# Patient Record
Sex: Female | Born: 1960 | Race: White | Hispanic: No | Marital: Married | Smoking: Never smoker
Health system: Southern US, Community
[De-identification: ages and names within clinical notes are randomized; demographics above are authoritative.]

## PROBLEM LIST (undated history)

## (undated) DIAGNOSIS — R739 Hyperglycemia, unspecified: Secondary | ICD-10-CM

## (undated) DIAGNOSIS — N951 Menopausal and female climacteric states: Secondary | ICD-10-CM

## (undated) DIAGNOSIS — B009 Herpesviral infection, unspecified: Secondary | ICD-10-CM

## (undated) DIAGNOSIS — E559 Vitamin D deficiency, unspecified: Secondary | ICD-10-CM

## (undated) DIAGNOSIS — C4491 Basal cell carcinoma of skin, unspecified: Secondary | ICD-10-CM

## (undated) DIAGNOSIS — E785 Hyperlipidemia, unspecified: Secondary | ICD-10-CM

## (undated) DIAGNOSIS — K219 Gastro-esophageal reflux disease without esophagitis: Secondary | ICD-10-CM

## (undated) HISTORY — DX: Herpesviral infection, unspecified: B00.9

## (undated) HISTORY — DX: Menopausal and female climacteric states: N95.1

## (undated) HISTORY — PX: TUBAL LIGATION: SHX77

## (undated) HISTORY — DX: Hyperlipidemia, unspecified: E78.5

## (undated) HISTORY — DX: Vitamin D deficiency, unspecified: E55.9

## (undated) HISTORY — PX: BREAST ENHANCEMENT SURGERY: SHX7

## (undated) HISTORY — DX: Gastro-esophageal reflux disease without esophagitis: K21.9

## (undated) HISTORY — DX: Basal cell carcinoma of skin, unspecified: C44.91

## (undated) HISTORY — DX: Hyperglycemia, unspecified: R73.9

## (undated) HISTORY — PX: BACK SURGERY: SHX140

## (undated) HISTORY — PX: CHOLECYSTECTOMY: SHX55

---

## 2007-01-31 ENCOUNTER — Emergency Department (HOSPITAL_COMMUNITY): Admission: EM | Admit: 2007-01-31 | Discharge: 2007-01-31 | Payer: Self-pay | Admitting: Emergency Medicine

## 2008-04-04 ENCOUNTER — Emergency Department (HOSPITAL_COMMUNITY): Admission: EM | Admit: 2008-04-04 | Discharge: 2008-04-04 | Payer: Self-pay | Admitting: Emergency Medicine

## 2008-04-22 ENCOUNTER — Emergency Department (HOSPITAL_COMMUNITY): Admission: EM | Admit: 2008-04-22 | Discharge: 2008-04-22 | Payer: Self-pay | Admitting: Emergency Medicine

## 2010-12-06 DIAGNOSIS — E785 Hyperlipidemia, unspecified: Secondary | ICD-10-CM | POA: Insufficient documentation

## 2010-12-06 DIAGNOSIS — Z85828 Personal history of other malignant neoplasm of skin: Secondary | ICD-10-CM | POA: Insufficient documentation

## 2010-12-06 DIAGNOSIS — B009 Herpesviral infection, unspecified: Secondary | ICD-10-CM

## 2010-12-24 LAB — HM MAMMOGRAPHY

## 2011-03-10 ENCOUNTER — Encounter: Payer: Self-pay | Admitting: Family Medicine

## 2011-03-10 DIAGNOSIS — R739 Hyperglycemia, unspecified: Secondary | ICD-10-CM | POA: Insufficient documentation

## 2011-06-12 LAB — URINALYSIS, ROUTINE W REFLEX MICROSCOPIC
Glucose, UA: NEGATIVE
Ketones, ur: NEGATIVE
Leukocytes, UA: NEGATIVE
Nitrite: NEGATIVE
Protein, ur: NEGATIVE

## 2011-06-12 LAB — URINE MICROSCOPIC-ADD ON

## 2011-06-12 LAB — PREGNANCY, URINE: Preg Test, Ur: NEGATIVE

## 2013-06-28 ENCOUNTER — Other Ambulatory Visit: Payer: Self-pay | Admitting: Family Medicine

## 2013-07-04 ENCOUNTER — Other Ambulatory Visit: Payer: Self-pay | Admitting: Family Medicine

## 2013-07-20 ENCOUNTER — Ambulatory Visit (INDEPENDENT_AMBULATORY_CARE_PROVIDER_SITE_OTHER): Admitting: Family Medicine

## 2013-07-20 ENCOUNTER — Encounter: Payer: Self-pay | Admitting: Family Medicine

## 2013-07-20 ENCOUNTER — Ambulatory Visit (INDEPENDENT_AMBULATORY_CARE_PROVIDER_SITE_OTHER)

## 2013-07-20 VITALS — BP 145/83 | HR 86 | Temp 98.1°F | Ht 62.0 in | Wt 170.0 lb

## 2013-07-20 DIAGNOSIS — K3189 Other diseases of stomach and duodenum: Secondary | ICD-10-CM

## 2013-07-20 DIAGNOSIS — Z85828 Personal history of other malignant neoplasm of skin: Secondary | ICD-10-CM

## 2013-07-20 DIAGNOSIS — R079 Chest pain, unspecified: Secondary | ICD-10-CM

## 2013-07-20 DIAGNOSIS — R5381 Other malaise: Secondary | ICD-10-CM

## 2013-07-20 DIAGNOSIS — E559 Vitamin D deficiency, unspecified: Secondary | ICD-10-CM | POA: Insufficient documentation

## 2013-07-20 DIAGNOSIS — R1013 Epigastric pain: Secondary | ICD-10-CM

## 2013-07-20 DIAGNOSIS — N951 Menopausal and female climacteric states: Secondary | ICD-10-CM | POA: Insufficient documentation

## 2013-07-20 DIAGNOSIS — IMO0001 Reserved for inherently not codable concepts without codable children: Secondary | ICD-10-CM

## 2013-07-20 DIAGNOSIS — E785 Hyperlipidemia, unspecified: Secondary | ICD-10-CM

## 2013-07-20 DIAGNOSIS — R739 Hyperglycemia, unspecified: Secondary | ICD-10-CM

## 2013-07-20 DIAGNOSIS — M2550 Pain in unspecified joint: Secondary | ICD-10-CM

## 2013-07-20 DIAGNOSIS — R7309 Other abnormal glucose: Secondary | ICD-10-CM

## 2013-07-20 LAB — POCT URINALYSIS DIPSTICK
Bilirubin, UA: NEGATIVE
Blood, UA: NEGATIVE
Glucose, UA: NEGATIVE
Ketones, UA: NEGATIVE
Leukocytes, UA: NEGATIVE
Nitrite, UA: NEGATIVE
Protein, UA: NEGATIVE
Spec Grav, UA: 1.01
Urobilinogen, UA: NEGATIVE
pH, UA: 6

## 2013-07-20 LAB — POCT UA - MICROSCOPIC ONLY
Bacteria, U Microscopic: NEGATIVE
Casts, Ur, LPF, POC: NEGATIVE
Crystals, Ur, HPF, POC: NEGATIVE
Mucus, UA: NEGATIVE
RBC, urine, microscopic: NEGATIVE
WBC, Ur, HPF, POC: NEGATIVE
Yeast, UA: NEGATIVE

## 2013-07-20 LAB — POCT CBC
Granulocyte percent: 71.4 %G (ref 37–80)
HCT, POC: 41.9 % (ref 37.7–47.9)
Hemoglobin: 14.1 g/dL (ref 12.2–16.2)
Lymph, poc: 1 (ref 0.6–3.4)
MCH, POC: 30.4 pg (ref 27–31.2)
MCHC: 33.8 g/dL (ref 31.8–35.4)
MCV: 90.1 fL (ref 80–97)
MPV: 7.6 fL (ref 0–99.8)
POC Granulocyte: 2.7 (ref 2–6.9)
POC LYMPH PERCENT: 25.5 %L (ref 10–50)
Platelet Count, POC: 227 10*3/uL (ref 142–424)
RBC: 4.7 M/uL (ref 4.04–5.48)
RDW, POC: 12.5 %
WBC: 3.8 10*3/uL — AB (ref 4.6–10.2)

## 2013-07-20 LAB — POCT GLYCOSYLATED HEMOGLOBIN (HGB A1C): Hemoglobin A1C: 6

## 2013-07-20 LAB — POCT UA - MICROALBUMIN: Microalbumin Ur, POC: NEGATIVE mg/L

## 2013-07-20 MED ORDER — PANTOPRAZOLE SODIUM 40 MG PO TBEC: 40.0000 mg | DELAYED_RELEASE_TABLET | Freq: Every day | ORAL | Status: DC

## 2013-07-20 NOTE — Progress Notes (Signed)
SUBJECTIVE: CC: Chief Complaint  Patient presents with  . Follow-up    reck labs refills and pain in shoulders    HPI: Patient is here for follow up of hyperlipidemia/hyperglycemia Used to be seen at [facility] and transferred for care. Has been having some problems: denies Headache;denies Chest Pain;denies weakness;denies Shortness of Breath and orthopnea;denies Visual changes;denies palpitations;denies cough;denies pedal edema;denies symptoms of TIA or stroke;deniesClaudication symptoms. admits to Compliance with medications; denies Problems with medications.  Having back problems and muscle aches. Having joints and muscle aches. Could barely lift arms. Tingling numbness. Especially at nights. Wakes up every 2 hours with aches.stopped the lipitor 2 weeks ago. Pains down arms. Neck sore. Fatigue. Heart burn   Past Medical History  Diagnosis Date  . Hyperlipidemia   . Hyperglycemia   . HSV-1 (herpes simplex virus 1) infection   . Basal cell cancer     On forehead.  . Perimenopausal   . Vitamin D deficiency    Past Surgical History  Procedure Laterality Date  . Breast enhancement surgery    . Tubal ligation    . Cholecystectomy    . Back surgery     History   Social History  . Marital Status: Married    Spouse Name: N/A    Number of Children: N/A  . Years of Education: N/A   Occupational History  . Not on file.   Social History Main Topics  . Smoking status: Never Smoker   . Smokeless tobacco: Not on file  . Alcohol Use: Not on file  . Drug Use: Not on file  . Sexual Activity: Not on file   Other Topics Concern  . Not on file   Social History Narrative  . No narrative on file   Family History  Problem Relation Age of Onset  . Heart disease Father    Current Outpatient Prescriptions on File Prior to Visit  Medication Sig Dispense Refill  . aspirin 81 MG EC tablet Take 81 mg by mouth daily.        . fish oil-omega-3 fatty acids 1000 MG capsule Take 2 g by  mouth 2 (two) times daily.        . metFORMIN (GLUCOPHAGE) 500 MG tablet TAKE 1 TABLET BY MOUTH EVERY DAY  30 tablet  0  . Multiple Vitamin (MULTIVITAMIN) tablet Take 1 tablet by mouth daily.        . Multiple Vitamins-Minerals (EYE VITAMINS PO) Take 1 capsule by mouth daily.        Marland Kitchen atorvastatin (LIPITOR) 40 MG tablet Take 40 mg by mouth daily.        Marland Kitchen dexlansoprazole (DEXILANT) 60 MG capsule Take 60 mg by mouth daily.         No current facility-administered medications on file prior to visit.   No Known Allergies Immunization History  Administered Date(s) Administered  . Hepatitis B 05/29/2000, 09/17/2000, 03/17/2001  . Td 04/07/1989, 05/26/2001, 08/25/2002   Prior to Admission medications   Medication Sig Start Date End Date Taking? Authorizing Provider  aspirin 81 MG EC tablet Take 81 mg by mouth daily.     Yes Historical Provider, MD  atorvastatin (LIPITOR) 40 MG tablet Take 40 mg by mouth daily.     Yes Historical Provider, MD  fish oil-omega-3 fatty acids 1000 MG capsule Take 2 g by mouth 2 (two) times daily.     Yes Historical Provider, MD  metFORMIN (GLUCOPHAGE) 500 MG tablet TAKE 1 TABLET BY MOUTH EVERY DAY  07/04/13  Yes Ileana Ladd, MD  Multiple Vitamin (MULTIVITAMIN) tablet Take 1 tablet by mouth daily.     Yes Historical Provider, MD  Multiple Vitamins-Minerals (EYE VITAMINS PO) Take 1 capsule by mouth daily.     Yes Historical Provider, MD  dexlansoprazole (DEXILANT) 60 MG capsule Take 60 mg by mouth daily.      Historical Provider, MD     ROS: As above in the HPI. All other systems are stable or negative.  OBJECTIVE: APPEARANCE:  Patient in no acute distress.The patient appeared well nourished and normally developed. Acyanotic. Waist: VITAL SIGNS:BP 145/83  Pulse 86  Temp(Src) 98.1 F (36.7 C) (Oral)  Ht 5\' 2"  (1.575 m)  Wt 170 lb (77.111 kg)  BMI 31.09 kg/m2  LMP 05/20/2013 bp 145/85 WF  SKIN: warm and  Dry without overt rashes, tattoos and  scars  HEAD and Neck: without JVD, Head and scalp: normal Eyes:No scleral icterus. Fundi normal, eye movements normal. Ears: Auricle normal, canal normal, Tympanic membranes normal, insufflation normal. Nose: normal Throat: normal Neck & thyroid: normal  CHEST & LUNGS: Chest wall: normal Lungs: Clear  CVS: Reveals the PMI to be normally located. Regular rhythm, First and Second Heart sounds are normal,  absence of murmurs, rubs or gallops. Peripheral vasculature: Radial pulses: normal Dorsal pedis pulses: normal Posterior pulses: normal  ABDOMEN:  Appearance: normal Benign, no organomegaly, no masses, no Abdominal Aortic enlargement. No Guarding , no rebound. No Bruits. Bowel sounds: normal  RECTAL: N/A GU: N/A  EXTREMETIES: nonedematous.  MUSCULOSKELETAL:  Spine: normal Joints: intact  NEUROLOGIC: oriented to time,place and person; nonfocal. Strength is normal Sensory is normal Reflexes are normal Cranial Nerves are normal.  ASSESSMENT:  Chest pain, unspecified - Plan: EKG 12-Lead, DG Chest 2 View, EKG 12-Lead, EKG 12-Lead, Magnesium  HLD (hyperlipidemia) - Plan: NMR, lipoprofile, Magnesium  Hyperglycemia - Plan: POCT glycosylated hemoglobin (Hb A1C), POCT UA - Microscopic Only, POCT UA - Microalbumin, POCT urinalysis dipstick, Magnesium  Hyperlipidemia - Plan: Magnesium  History of basal cell cancer - Plan: Magnesium  Arthralgia - Plan: Sedimentation rate, Rheumatoid factor, Magnesium  Myalgia and myositis - Plan: CMP14+EGFR, Sedimentation rate, Vit D  25 hydroxy (rtn osteoporosis monitoring), Vitamin B12, Thyroid Panel With TSH, Folate, CK, ANA, Rheumatoid factor, Lyme Ab/Western Blot Reflex, Myoglobin, serum, Lactic Acid, Plasma, Magnesium  Other malaise and fatigue - Plan: POCT CBC, CMP14+EGFR, Magnesium  Dyspepsia - Plan: H Pylori, IGM, IGG, IGA AB, pantoprazole (PROTONIX) 40 MG tablet, Magnesium  PLAN:  EKG: no acute findings. [facility] reading  (PRIMARY) by  Dr. Modesto Charon    : no acute findings.                              Orders Placed This Encounter  Procedures  . DG Chest 2 View    Standing Status: Future     Number of Occurrences: 1     Standing Expiration Date: 09/19/2014    Order Specific Question:  Reason for Exam (SYMPTOM  OR DIAGNOSIS REQUIRED)    Answer:  hyperlipidemia, hyperglycemia, chest pains.    Order Specific Question:  Is the patient pregnant?    Answer:  No    Order Specific Question:  Preferred imaging location?    Answer:  Internal  . CMP14+EGFR  . NMR, lipoprofile  . Sedimentation rate  . Vit D  25 hydroxy (rtn osteoporosis monitoring)  . Vitamin B12  . Thyroid Panel With  TSH  . Folate  . CK  . ANA  . Rheumatoid factor  . Lyme Ab/Western Blot Reflex  . H Pylori, IGM, IGG, IGA AB  . Myoglobin, serum  . Lactic Acid, Plasma  . Magnesium  . POCT CBC  . POCT glycosylated hemoglobin (Hb A1C)  . POCT UA - Microscopic Only  . POCT UA - Microalbumin  . POCT urinalysis dipstick  . EKG 12-Lead  . EKG 12-Lead    Standing Status: Standing     Number of Occurrences: 1     Standing Expiration Date:     Order Specific Question:  Reason for Exam    Answer:  pain   Meds ordered this encounter  Medications  . pantoprazole (PROTONIX) 40 MG tablet    Sig: Take 1 tablet (40 mg total) by mouth daily.    Dispense:  30 tablet    Refill:  3   There are no discontinued medications. Return in about 1 week (around 07/27/2013) for Recheck medical problems. Await work up.  Cornisha Zetino P. Modesto Charon, M.D.

## 2013-07-20 NOTE — Patient Instructions (Addendum)
Dr Woodroe Mode Recommendations  For nutrition information, I recommend books:  1).Eat to Live by Dr Monico Hoar. 2).Prevent and Reverse Heart Disease by Dr Suzzette Righter. 3) Dr Katherina Right Book:  Program to Reverse Diabetes  Exercise recommendations are:  If unable to walk, then the patient can exercise in a chair 3 times a day. By flapping arms like a bird gently and raising legs outwards to the front.  If ambulatory, the patient can go for walks for 30 minutes 3 times a week. Then increase the intensity and duration as tolerated.  Goal is to try to attain exercise frequency to 5 times a week.  If applicable: Best to perform resistance exercises (machines or weights) 2 days a week and cardio type exercises 3 days per week.   Stop all medications: except aspirin and multivitamins.   Gastroesophageal Reflux Disease, Adult Gastroesophageal reflux disease (GERD) happens when acid from your stomach flows up into the esophagus. When acid comes in contact with the esophagus, the acid causes soreness (inflammation) in the esophagus. Over time, GERD may create small holes (ulcers) in the lining of the esophagus. CAUSES   Increased body weight. This puts pressure on the stomach, making acid rise from the stomach into the esophagus.  Smoking. This increases acid production in the stomach.  Drinking alcohol. This causes decreased pressure in the lower esophageal sphincter (valve or ring of muscle between the esophagus and stomach), allowing acid from the stomach into the esophagus.  Late evening meals and a full stomach. This increases pressure and acid production in the stomach.  A malformed lower esophageal sphincter. Sometimes, no cause is found. SYMPTOMS   Burning pain in the lower part of the mid-chest behind the breastbone and in the mid-stomach area. This may occur twice a week or more often.  Trouble swallowing.  Sore throat.  Dry cough.  Asthma-like  symptoms including chest tightness, shortness of breath, or wheezing. DIAGNOSIS  Your caregiver may be able to diagnose GERD based on your symptoms. In some cases, X-rays and other tests may be done to check for complications or to check the condition of your stomach and esophagus. TREATMENT  Your caregiver may recommend over-the-counter or prescription medicines to help decrease acid production. Ask your caregiver before starting or adding any new medicines.  HOME CARE INSTRUCTIONS   Change the factors that you can control. Ask your caregiver for guidance concerning weight loss, quitting smoking, and alcohol consumption.  Avoid foods and drinks that make your symptoms worse, such as:  Caffeine or alcoholic drinks.  Chocolate.  Peppermint or mint flavorings.  Garlic and onions.  Spicy foods.  Citrus fruits, such as oranges, lemons, or limes.  Tomato-based foods such as sauce, chili, salsa, and pizza.  Fried and fatty foods.  Avoid lying down for the 3 hours prior to your bedtime or prior to taking a nap.  Eat small, frequent meals instead of large meals.  Wear loose-fitting clothing. Do not wear anything tight around your waist that causes pressure on your stomach.  Raise the head of your bed 6 to 8 inches with wood blocks to help you sleep. Extra pillows will not help.  Only take over-the-counter or prescription medicines for pain, discomfort, or fever as directed by your caregiver.  Do not take aspirin, ibuprofen, or other nonsteroidal anti-inflammatory drugs (NSAIDs). SEEK IMMEDIATE MEDICAL CARE IF:   You have pain in your arms, neck, jaw, teeth, or back.  Your pain increases or  changes in intensity or duration.  You develop nausea, vomiting, or sweating (diaphoresis).  You develop shortness of breath, or you faint.  Your vomit is green, yellow, black, or looks like coffee grounds or blood.  Your stool is red, bloody, or black. These symptoms could be signs of  other problems, such as heart disease, gastric bleeding, or esophageal bleeding. MAKE SURE YOU:   Understand these instructions.  Will watch your condition.  Will get help right away if you are not doing well or get worse. Document Released: 05/21/2005 Document Revised: 11/03/2011 Document Reviewed: 02/28/2011 [facility] Patient Information 2014 [place], [place].

## 2013-07-25 LAB — H PYLORI, IGM, IGG, IGA AB
H Pylori IgG: <0.9 U/mL (ref 0.0–0.8)
H. pylori, IgA Abs: <9 units (ref 0.0–8.9)
H. pylori, IgM Abs: <9 units (ref 0.0–8.9)

## 2013-07-25 LAB — THYROID PANEL WITH TSH
Free Thyroxine Index: 2.3 (ref 1.2–4.9)
T3 Uptake Ratio: 27 % (ref 24–39)
T4, Total: 8.5 ug/dL (ref 4.5–12.0)
TSH: 1.77 u[IU]/mL (ref 0.450–4.500)

## 2013-07-25 LAB — NMR, LIPOPROFILE
Cholesterol: 272 mg/dL — ABNORMAL HIGH (ref <200)
HDL Cholesterol by NMR: 47 mg/dL (ref >=40)
HDL Particle Number: 29.4 umol/L — ABNORMAL LOW (ref >=30.5)
LDL Particle Number: 2788 nmol/L — ABNORMAL HIGH (ref <1000)
LDL Size: 20.6 nm (ref >20.5)
LDLC SERPL CALC-MCNC: 177 mg/dL — ABNORMAL HIGH (ref <100)
LP-IR Score: 67 — ABNORMAL HIGH (ref <=45)
Small LDL Particle Number: 1759 nmol/L — ABNORMAL HIGH (ref <=527)
Triglycerides by NMR: 242 mg/dL — ABNORMAL HIGH (ref <150)

## 2013-07-25 LAB — CMP14+EGFR
ALT: 26 IU/L (ref 0–32)
AST: 24 IU/L (ref 0–40)
Albumin/Globulin Ratio: 1.7 (ref 1.1–2.5)
Albumin: 4.6 g/dL (ref 3.5–5.5)
Alkaline Phosphatase: 71 IU/L (ref 39–117)
BUN/Creatinine Ratio: 27 — ABNORMAL HIGH (ref 9–23)
BUN: 17 mg/dL (ref 6–24)
CO2: 24 mmol/L (ref 18–29)
Calcium: 10 mg/dL (ref 8.7–10.2)
Chloride: 102 mmol/L (ref 97–108)
Creatinine, Ser: 0.64 mg/dL (ref 0.57–1.00)
GFR calc Af Amer: 119 mL/min/{1.73_m2} (ref >59)
GFR calc non Af Amer: 104 mL/min/{1.73_m2} (ref >59)
Globulin, Total: 2.7 g/dL (ref 1.5–4.5)
Glucose: 111 mg/dL — ABNORMAL HIGH (ref 65–99)
Potassium: 4.5 mmol/L (ref 3.5–5.2)
Sodium: 143 mmol/L (ref 134–144)
Total Bilirubin: 0.2 mg/dL (ref 0.0–1.2)
Total Protein: 7.3 g/dL (ref 6.0–8.5)

## 2013-07-25 LAB — CK: Total CK: 98 U/L (ref 24–173)

## 2013-07-25 LAB — RHEUMATOID FACTOR: Rhuematoid fact SerPl-aCnc: 10.5 IU/mL (ref 0.0–13.9)

## 2013-07-25 LAB — SEDIMENTATION RATE: Sed Rate: 12 mm/hr (ref 0–40)

## 2013-07-25 LAB — ANA: Anti Nuclear Antibody(ANA): NEGATIVE

## 2013-07-25 LAB — LYME AB/WESTERN BLOT REFLEX
LYME DISEASE AB, QUANT, IGM: <0.8 index (ref 0.00–0.79)
Lyme IgG/IgM Ab: <0.91 {ISR} (ref 0.00–0.90)

## 2013-07-25 LAB — MYOGLOBIN, SERUM: Myoglobin: <21 ng/mL — ABNORMAL LOW (ref 25–58)

## 2013-07-25 LAB — VITAMIN D 25 HYDROXY (VIT D DEFICIENCY, FRACTURES): Vit D, 25-Hydroxy: 39 ng/mL (ref 30.0–100.0)

## 2013-07-25 LAB — LACTIC ACID, PLASMA: Lactate, Ven: 17.4 mg/dL (ref 4.5–19.8)

## 2013-07-25 LAB — MAGNESIUM: Magnesium: 2.2 mg/dL (ref 1.6–2.6)

## 2013-07-25 LAB — VITAMIN B12: Vitamin B-12: 584 pg/mL (ref 211–946)

## 2013-07-25 LAB — FOLATE: Folate: >19.9 ng/mL (ref >3.0)

## 2013-07-26 NOTE — Progress Notes (Signed)
Quick Note:  Call Patient Labs abnormal:cholesterol is high  However the rest of the labs are fine. The HGBA1C is great.  Recommendations: Take a break off the atorvastatin to see if it is causing her pain and keep the follow up next week as planned.    ______

## 2013-08-01 ENCOUNTER — Ambulatory Visit (INDEPENDENT_AMBULATORY_CARE_PROVIDER_SITE_OTHER): Admitting: Family Medicine

## 2013-08-01 ENCOUNTER — Encounter: Payer: Self-pay | Admitting: Family Medicine

## 2013-08-01 VITALS — BP 143/73 | HR 71 | Temp 98.3°F | Ht 62.0 in | Wt 170.0 lb

## 2013-08-01 DIAGNOSIS — K219 Gastro-esophageal reflux disease without esophagitis: Secondary | ICD-10-CM | POA: Insufficient documentation

## 2013-08-01 DIAGNOSIS — R739 Hyperglycemia, unspecified: Secondary | ICD-10-CM

## 2013-08-01 DIAGNOSIS — B009 Herpesviral infection, unspecified: Secondary | ICD-10-CM

## 2013-08-01 DIAGNOSIS — Z85828 Personal history of other malignant neoplasm of skin: Secondary | ICD-10-CM

## 2013-08-01 DIAGNOSIS — R7309 Other abnormal glucose: Secondary | ICD-10-CM

## 2013-08-01 DIAGNOSIS — E785 Hyperlipidemia, unspecified: Secondary | ICD-10-CM

## 2013-08-01 DIAGNOSIS — N951 Menopausal and female climacteric states: Secondary | ICD-10-CM

## 2013-08-01 DIAGNOSIS — E559 Vitamin D deficiency, unspecified: Secondary | ICD-10-CM

## 2013-08-01 MED ORDER — EZETIMIBE 10 MG PO TABS: 10.0000 mg | ORAL_TABLET | Freq: Every day | ORAL | Status: DC

## 2013-08-01 NOTE — Progress Notes (Signed)
Patient ID: Shannon Mora, female   DOB: [birth year], 52 y.o.   MRN: [id number] SUBJECTIVE: CC: Chief Complaint  Patient presents with  . Follow-up    1 week follow up states feeling better with shoulder pain     HPI:  Patient is here for follow up of hyperlipidemia/myalgias/perimenopausal/hyperglycemia: denies Headache;denies Chest Pain;denies weakness;denies Shortness of Breath and orthopnea;denies Visual changes;denies palpitations;denies cough;denies pedal edema;denies symptoms of TIA or stroke;deniesClaudication symptoms. admits to Compliance with medications; denies Problems with medications.  Feels better since d/c both the statin and the metformin. Muscle aches are better.  GERD symptoms resolved  Past Medical History  Diagnosis Date  . Hyperlipidemia   . Hyperglycemia   . HSV-1 (herpes simplex virus 1) infection   . Basal cell cancer     On forehead.  . Perimenopausal   . Vitamin D deficiency   . GERD (gastroesophageal reflux disease)    Past Surgical History  Procedure Laterality Date  . Breast enhancement surgery    . Tubal ligation    . Cholecystectomy    . Back surgery     History   Social History  . Marital Status: Married    Spouse Name: N/A    Number of Children: N/A  . Years of Education: N/A   Occupational History  . Not on file.   Social History Main Topics  . Smoking status: Never Smoker   . Smokeless tobacco: Not on file  . Alcohol Use: Not on file  . Drug Use: Not on file  . Sexual Activity: Not on file   Other Topics Concern  . Not on file   Social History Narrative  . No narrative on file   Family History  Problem Relation Age of Onset  . Heart disease Father    Current Outpatient Prescriptions on File Prior to Visit  Medication Sig Dispense Refill  . aspirin 81 MG EC tablet Take 81 mg by mouth daily.        . fish oil-omega-3 fatty acids 1000 MG capsule Take 2 g by mouth 2 (two) times daily.        . Multiple Vitamin  (MULTIVITAMIN) tablet Take 1 tablet by mouth daily.        . Multiple Vitamins-Minerals (EYE VITAMINS PO) Take 1 capsule by mouth daily.        . pantoprazole (PROTONIX) 40 MG tablet Take 1 tablet (40 mg total) by mouth daily.  30 tablet  3  . metFORMIN (GLUCOPHAGE) 500 MG tablet TAKE 1 TABLET BY MOUTH EVERY DAY  30 tablet  0   No current facility-administered medications on file prior to visit.   No Known Allergies Immunization History  Administered Date(s) Administered  . Hepatitis B 05/29/2000, 09/17/2000, 03/17/2001  . Td 04/07/1989, 05/26/2001, 08/25/2002   Prior to Admission medications   Medication Sig Start Date End Date Taking? Authorizing Provider  aspirin 81 MG EC tablet Take 81 mg by mouth daily.      Historical Provider, MD  atorvastatin (LIPITOR) 40 MG tablet Take 40 mg by mouth daily.      Historical Provider, MD  dexlansoprazole (DEXILANT) 60 MG capsule Take 60 mg by mouth daily.      Historical Provider, MD  fish oil-omega-3 fatty acids 1000 MG capsule Take 2 g by mouth 2 (two) times daily.      Historical Provider, MD  metFORMIN (GLUCOPHAGE) 500 MG tablet TAKE 1 TABLET BY MOUTH EVERY DAY 07/04/13   Ileana Ladd, MD  Multiple Vitamin (MULTIVITAMIN) tablet Take 1 tablet by mouth daily.      Historical Provider, MD  Multiple Vitamins-Minerals (EYE VITAMINS PO) Take 1 capsule by mouth daily.      Historical Provider, MD  pantoprazole (PROTONIX) 40 MG tablet Take 1 tablet (40 mg total) by mouth daily. 07/20/13   Ileana Ladd, MD     ROS: As above in the HPI. All other systems are stable or negative.  OBJECTIVE: APPEARANCE:  Patient in no acute distress.The patient appeared well nourished and normally developed. Acyanotic. Waist: VITAL SIGNS:BP 143/73  Pulse 71  Temp(Src) 98.3 F (36.8 C) (Oral)  Ht 5\' 2"  (1.575 m)  Wt 170 lb (77.111 kg)  BMI 31.09 kg/m2  LMP 05/20/2013  Obese WF  SKIN: warm and  Dry without overt rashes, tattoos and scars  HEAD and  Neck: without JVD, Head and scalp: normal Eyes:No scleral icterus. Fundi normal, eye movements normal. Ears: Auricle normal, canal normal, Tympanic membranes normal, insufflation normal. Nose: normal Throat: normal Neck & thyroid: normal  CHEST & LUNGS: Chest wall: normal Lungs: Clear  CVS: Reveals the PMI to be normally located. Regular rhythm, First and Second Heart sounds are normal,  absence of murmurs, rubs or gallops. Peripheral vasculature: Radial pulses: normal Dorsal pedis pulses: normal Posterior pulses: normal  ABDOMEN:  Appearance: normal Benign, no organomegaly, no masses, no Abdominal Aortic enlargement. No Guarding , no rebound. No Bruits. Bowel sounds: normal  RECTAL: N/A GU: N/A  EXTREMETIES: nonedematous.  MUSCULOSKELETAL:  No muscle pains  NEUROLOGIC: oriented to time,place and person; nonfocal.   Results for orders placed in visit on 07/20/13  CMP14+EGFR      Result Value Range   Glucose 111 (*) 65 - 99 mg/dL   BUN 17  6 - 24 mg/dL   Creatinine, Ser 2.95  0.57 - 1.00 mg/dL   GFR calc non Af Amer 104  >59 mL/min/1.73   GFR calc Af Amer 119  >59 mL/min/1.73   BUN/Creatinine Ratio 27 (*) 9 - 23   Sodium 143  134 - 144 mmol/L   Potassium 4.5  3.5 - 5.2 mmol/L   Chloride 102  97 - 108 mmol/L   CO2 24  18 - 29 mmol/L   Calcium 10.0  8.7 - 10.2 mg/dL   Total Protein 7.3  6.0 - 8.5 g/dL   Albumin 4.6  3.5 - 5.5 g/dL   Globulin, Total 2.7  1.5 - 4.5 g/dL   Albumin/Globulin Ratio 1.7  1.1 - 2.5   Total Bilirubin 0.2  0.0 - 1.2 mg/dL   Alkaline Phosphatase 71  39 - 117 IU/L   AST 24  0 - 40 IU/L   ALT 26  0 - 32 IU/L  NMR, LIPOPROFILE      Result Value Range   LDL Particle Number [id number] (*) <1000 nmol/L   LDLC SERPL CALC-MCNC 177 (*) <100 mg/dL   HDL Cholesterol by NMR 47  >=40 mg/dL   Triglycerides by NMR 242 (*) <150 mg/dL   Cholesterol 284 (*) <132 mg/dL   HDL Particle Number [id number] (*) >=30.5 umol/L   Small LDL Particle Number [id number] (*)  <=527 nmol/L   LDL Size 20.6  >20.5 nm   LP-IR Score 67 (*) <=45  SEDIMENTATION RATE      Result Value Range   Sed Rate 12  0 - 40 mm/hr  VITAMIN D 25 HYDROXY      Result Value Range   Vit D, 25-Hydroxy 39.0  30.0 - 100.0 ng/mL  VITAMIN B12      Result Value Range   Vitamin B-12 584  211 - 946 pg/mL  THYROID PANEL WITH TSH      Result Value Range   TSH 1.770  0.450 - 4.500 uIU/mL   T4, Total 8.5  4.5 - 12.0 ug/dL   T3 Uptake Ratio 27  24 - 39 %   Free Thyroxine Index 2.3  1.2 - 4.9  FOLATE      Result Value Range   Folate >19.9  >3.0 ng/mL  CK      Result Value Range   Total CK 98  24 - 173 U/L  ANA      Result Value Range   ANA Negative  Negative  RHEUMATOID FACTOR      Result Value Range   Rheumatoid Factor 10.5  0.0 - 13.9 IU/mL  H PYLORI, IGM, IGG, IGA AB      Result Value Range   H Pylori IgG <0.9  0.0 - 0.8 U/mL   H. pylori, IgA Abs <9.0  0.0 - 8.9 units   H. pylori, IgM Abs <9.0  0.0 - 8.9 units  LYME AB/WESTERN BLOT REFLEX      Result Value Range   Lyme IgG/IgM Ab <0.91  0.00 - 0.90 ISR   LYME DISEASE AB, QUANT, IGM <0.80  0.00 - 0.79 index  MYOGLOBIN, SERUM      Result Value Range   Myoglobin <21 (*) 25 - 58 ng/mL  MAGNESIUM      Result Value Range   Magnesium 2.2  1.6 - 2.6 mg/dL  LACTIC ACID, PLASMA      Result Value Range   Lactate, Ven 17.4  4.5 - 19.8 mg/dL  POCT CBC      Result Value Range   WBC 3.8 (*) 4.6 - 10.2 K/uL   Lymph, poc 1.0  0.6 - 3.4   POC LYMPH PERCENT 25.5  10 - 50 %L   POC Granulocyte 2.7  2 - 6.9   Granulocyte percent 71.4  37 - 80 %G   RBC 4.7  4.04 - 5.48 M/uL   Hemoglobin 14.1  12.2 - 16.2 g/dL   HCT, POC 16.1  09.6 - 47.9 %   MCV 90.1  80 - 97 fL   MCH, POC 30.4  27 - 31.2 pg   MCHC 33.8  31.8 - 35.4 g/dL   RDW, POC 04.5     Platelet Count, POC 227.0  142 - 424 K/uL   MPV 7.6  0 - 99.8 fL  POCT GLYCOSYLATED HEMOGLOBIN (HGB A1C)      Result Value Range   Hemoglobin A1C 6.0%    POCT UA - MICROSCOPIC ONLY      Result  Value Range   WBC, Ur, HPF, POC neg     RBC, urine, microscopic neg     Bacteria, U Microscopic neg     Mucus, UA neg     Epithelial cells, urine per micros occ     Crystals, Ur, HPF, POC neg     Casts, Ur, LPF, POC neg     Yeast, UA neg    POCT UA - MICROALBUMIN      Result Value Range   Microalbumin Ur, POC negative    POCT URINALYSIS DIPSTICK      Result Value Range   Color, UA yellow     Clarity, UA clear     Glucose, UA neg  Bilirubin, UA neg     Ketones, UA neg     Spec Grav, UA 1.010     Blood, UA neg     pH, UA 6.0     Protein, UA neg     Urobilinogen, UA negative     Nitrite, UA neg     Leukocytes, UA Negative      ASSESSMENT: HLD (hyperlipidemia) - Plan: ezetimibe (ZETIA) 10 MG tablet, Lactic acid, plasma, NMR, lipoprofile  Hyperglycemia  GERD (gastroesophageal reflux disease)  History of basal cell cancer  HSV-1 (herpes simplex virus 1) infection  Perimenopausal  Vitamin D deficiency  PLAN:  Discussed her results and need for plant based  Diet and a lipid lowering medication.  Patient to use the recipes from Dr Rosana Hoes book. Weight reduction and exercise. Start on zetia in the meantime. Patient want time to aggressively diet and lose weight before trying any other statin due to side effects.   Orders Placed This Encounter  Procedures  . Lactic acid, plasma  . NMR, lipoprofile   Meds ordered this encounter  Medications  . ezetimibe (ZETIA) 10 MG tablet    Sig: Take 1 tablet (10 mg total) by mouth daily.    Dispense:  30 tablet    Refill:  5   Medications Discontinued During This Encounter  Medication Reason  . dexlansoprazole (DEXILANT) 60 MG capsule Change in therapy  . atorvastatin (LIPITOR) 40 MG tablet Side effect (s)   Return in about 4 months (around 11/30/2013) for Recheck medical problems.  Topacio Cella P. Modesto Charon, M.D.

## 2013-08-02 LAB — LACTIC ACID, PLASMA: Lactate, Ven: 8.6 mg/dL (ref 4.5–19.8)

## 2013-08-02 LAB — SPECIMEN STATUS REPORT

## 2013-08-03 LAB — NMR, LIPOPROFILE
Cholesterol: 280 mg/dL — ABNORMAL HIGH (ref <200)
HDL Cholesterol by NMR: 42 mg/dL (ref >=40)
HDL Particle Number: 31.4 umol/L (ref >=30.5)
LDL Particle Number: 2743 nmol/L — ABNORMAL HIGH (ref <1000)
LDL Size: 20.2 nm — ABNORMAL LOW (ref >20.5)
LDLC SERPL CALC-MCNC: 176 mg/dL — ABNORMAL HIGH (ref <100)
LP-IR Score: 78 — ABNORMAL HIGH (ref <=45)
Small LDL Particle Number: 1736 nmol/L — ABNORMAL HIGH (ref <=527)
Triglycerides by NMR: 309 mg/dL — ABNORMAL HIGH (ref <150)

## 2013-08-03 NOTE — Progress Notes (Signed)
Quick Note:  Call Patient Labs that are abnormal: Lipids are still very high Lactic acid was normal. The rest are at goal  Recommendations: No change in recommendations. Start the zetia. Plant based Diet.   ______

## 2013-08-16 ENCOUNTER — Telehealth: Payer: Self-pay | Admitting: *Deleted

## 2013-08-16 NOTE — Telephone Encounter (Signed)
Message copied by Baltazar Apo on Tue Aug 16, 2013 11:35 AM ------      Message from: Ileana Ladd      Created: Wed Aug 03, 2013  2:13 PM       Call Patient      Labs that are abnormal:      Lipids are still very high      Lactic acid was normal.      The rest are at goal            Recommendations:      No change in recommendations.      Start the zetia.      Plant based  Diet.             ------

## 2013-09-12 ENCOUNTER — Encounter: Payer: Self-pay | Admitting: *Deleted

## 2013-11-04 ENCOUNTER — Ambulatory Visit (INDEPENDENT_AMBULATORY_CARE_PROVIDER_SITE_OTHER): Admitting: Family Medicine

## 2013-11-04 ENCOUNTER — Encounter: Payer: Self-pay | Admitting: Family Medicine

## 2013-11-04 VITALS — BP 143/78 | HR 80 | Temp 97.9°F | Ht 62.0 in | Wt 167.0 lb

## 2013-11-04 DIAGNOSIS — IMO0001 Reserved for inherently not codable concepts without codable children: Secondary | ICD-10-CM

## 2013-11-04 DIAGNOSIS — R739 Hyperglycemia, unspecified: Secondary | ICD-10-CM

## 2013-11-04 DIAGNOSIS — N951 Menopausal and female climacteric states: Secondary | ICD-10-CM

## 2013-11-04 DIAGNOSIS — M791 Myalgia, unspecified site: Secondary | ICD-10-CM

## 2013-11-04 DIAGNOSIS — E785 Hyperlipidemia, unspecified: Secondary | ICD-10-CM

## 2013-11-04 DIAGNOSIS — R7309 Other abnormal glucose: Secondary | ICD-10-CM

## 2013-11-04 DIAGNOSIS — K219 Gastro-esophageal reflux disease without esophagitis: Secondary | ICD-10-CM

## 2013-11-04 MED ORDER — AMITRIPTYLINE HCL 10 MG PO TABS: 10.0000 mg | ORAL_TABLET | Freq: Every day | ORAL | Status: DC

## 2013-11-04 NOTE — Progress Notes (Signed)
Patient ID: Shannon Mora, female   DOB: 1961/07/15, 53 y.o.   MRN: [id number] SUBJECTIVE: CC: Chief Complaint  Patient presents with  . Follow-up    SHOULDER PAIN NO BETTER OFF LIPITOR AND ALL MEDS     HPI:  New mattress 1 year ago.has low back pain and had surgery. Since then she has woken up in the night uncomfortable and hurting and her body is sore all over her back and shoulders. Aches every day and is fatigued. Not sure if she is restless and inadequate sleep. Certainly no sleep apnea. She has stopped the statin and initially there was some improvement in the aches. But it continues. She is changing her diet and has lost a few pounds. She is not exercising. Every time she exercises she is so sore that she cannot exercise for 3 days. She has only exercised a few days this year so far.  Past Medical History  Diagnosis Date  . Hyperlipidemia   . Hyperglycemia   . HSV-1 (herpes simplex virus 1) infection   . Basal cell cancer     On forehead.  . Perimenopausal   . Vitamin D deficiency   . GERD (gastroesophageal reflux disease)    Past Surgical History  Procedure Laterality Date  . Breast enhancement surgery    . Tubal ligation    . Cholecystectomy    . Back surgery     History   Social History  . Marital Status: Married    Spouse Name: N/A    Number of Children: N/A  . Years of Education: N/A   Occupational History  . Not on file.   Social History Main Topics  . Smoking status: Never Smoker   . Smokeless tobacco: Not on file  . Alcohol Use: Not on file  . Drug Use: Not on file  . Sexual Activity: Not on file   Other Topics Concern  . Not on file   Social History Narrative  . No narrative on file   Family History  Problem Relation Age of Onset  . Heart disease Father    Current Outpatient Prescriptions on File Prior to Visit  Medication Sig Dispense Refill  . aspirin 81 MG EC tablet Take 81 mg by mouth daily.        . Multiple Vitamin (MULTIVITAMIN)  tablet Take 1 tablet by mouth daily.        Marland Kitchen ezetimibe (ZETIA) 10 MG tablet Take 1 tablet (10 mg total) by mouth daily.  30 tablet  5  . fish oil-omega-3 fatty acids 1000 MG capsule Take 2 g by mouth 2 (two) times daily.        . metFORMIN (GLUCOPHAGE) 500 MG tablet TAKE 1 TABLET BY MOUTH EVERY DAY  30 tablet  0  . Multiple Vitamins-Minerals (EYE VITAMINS PO) Take 1 capsule by mouth daily.        . pantoprazole (PROTONIX) 40 MG tablet Take 1 tablet (40 mg total) by mouth daily.  30 tablet  3   No current facility-administered medications on file prior to visit.   No Known Allergies Immunization History  Administered Date(s) Administered  . Hepatitis B 05/29/2000, 09/17/2000, 03/17/2001  . Td 04/07/1989, 05/26/2001, 08/25/2002   Prior to Admission medications   Medication Sig Start Date End Date Taking? Authorizing Provider  aspirin 81 MG EC tablet Take 81 mg by mouth daily.      Historical Provider, MD  ezetimibe (ZETIA) 10 MG tablet Take 1 tablet (10 mg total) by  mouth daily. 08/01/13   Vernie Shanks, MD  fish oil-omega-3 fatty acids 1000 MG capsule Take 2 g by mouth 2 (two) times daily.      Historical Provider, MD  metFORMIN (GLUCOPHAGE) 500 MG tablet TAKE 1 TABLET BY MOUTH EVERY DAY 07/04/13   Vernie Shanks, MD  Multiple Vitamin (MULTIVITAMIN) tablet Take 1 tablet by mouth daily.      Historical Provider, MD  Multiple Vitamins-Minerals (EYE VITAMINS PO) Take 1 capsule by mouth daily.      Historical Provider, MD  pantoprazole (PROTONIX) 40 MG tablet Take 1 tablet (40 mg total) by mouth daily. 07/20/13   Vernie Shanks, MD     ROS: As above in the HPI. All other systems are stable or negative.  OBJECTIVE: APPEARANCE:  Patient in no acute distress.The patient appeared well nourished and normally developed. Acyanotic. Waist: VITAL SIGNS:BP 143/78  Pulse 80  Temp(Src) 97.9 F (36.6 C) (Oral)  Ht 5\' 2"  (1.575 m)  Wt 167 lb (75.751 kg)  BMI 30.54 kg/m2  LMP 10/07/2013  WF  borderline obese  SKIN: warm and  Dry without overt rashes, tattoos and scars  HEAD and Neck: without JVD, Head and scalp: normal Eyes:No scleral icterus. Fundi normal, eye movements normal. Ears: Auricle normal, canal normal, Tympanic membranes normal, insufflation normal. Nose: normal Throat: normal Neck & thyroid: normal  CHEST & LUNGS: Chest wall: normal Lungs: Clear  CVS: Reveals the PMI to be normally located. Regular rhythm, First and Second Heart sounds are normal,  absence of murmurs, rubs or gallops. Peripheral vasculature: Radial pulses: normal Dorsal pedis pulses: normal Posterior pulses: normal  ABDOMEN:  Appearance: normal Benign, no organomegaly, no masses, no Abdominal Aortic enlargement. No Guarding , no rebound. No Bruits. Bowel sounds: normal  RECTAL: N/A GU: N/A  EXTREMETIES: nonedematous.  MUSCULOSKELETAL:  Spine: normal. Slight reduction in ROM Joints: intact. Muscle trigger points: some positivity at the deltoid areas.none elsewhere  NEUROLOGIC: oriented to time,place and person; nonfocal. Strength is normal Sensory is normal Reflexes are normal Cranial Nerves are normal.  ASSESSMENT:  Hyperlipidemia  Hyperglycemia  Perimenopausal  GERD (gastroesophageal reflux disease)  Generalized muscle ache - Plan: Ambulatory referral to Physical Therapy, amitriptyline (ELAVIL) 10 MG tablet Suspect that her Lumbar spine surgery has resulted in some residual chronic low back discomfort. This has caused sleep disturbance and hence a fibromyalgia like symptoms.   PLAN: Agree with the new mattress that she has acquired,  Orders Placed This Encounter  Procedures  . Ambulatory referral to Physical Therapy    Referral Priority:  Routine    Referral Type:  Physical Medicine    Referral Reason:  Specialty Services Required    Requested Specialty:  Physical Therapy    Number of Visits Requested:  1  Physical Therapy Amitriptyline at night Water  aerobics 2 to 3 days a week. 2 days a week do resistance exercise In 2 weeks restart the zetia. See me in 4 weeks as planned.   Reviewed her labs  Results for orders placed in visit on 08/01/13  LACTIC ACID, PLASMA      Result Value Ref Range   Lactate, Ven 8.6  4.5 - 19.8 mg/dL  NMR, LIPOPROFILE      Result Value Ref Range   LDL Particle Number [id number] (*) <1000 nmol/L   LDLC SERPL CALC-MCNC 176 (*) <100 mg/dL   HDL Cholesterol by NMR 42  >=40 mg/dL   Triglycerides by NMR 309 (*) <150 mg/dL  Cholesterol 280 (*) <200 mg/dL   HDL Particle Number 31.4  >=30.5 umol/L   Small LDL Particle Number [id number] (*) <=527 nmol/L   LDL Size 20.2 (*) >20.5 nm   LP-IR Score 78 (*) <=45  SPECIMEN STATUS REPORT      Result Value Ref Range   specimen status report Comment      Meds ordered this encounter  Medications  . vitamin B-12 (CYANOCOBALAMIN) 500 MCG tablet    Sig: Take 500 mcg by mouth daily.  . Flaxseed, Linseed, (FLAX SEEDS PO)    Sig: Take by mouth.  Marland Kitchen CHIA SEED PO    Sig: Take by mouth.  Marland Kitchen amitriptyline (ELAVIL) 10 MG tablet    Sig: Take 1 tablet (10 mg total) by mouth at bedtime.    Dispense:  30 tablet    Refill:  1   There are no discontinued medications. Return for has appointment scheduled already for april, Recheck medical problems.  Demere Dotzler P. Jacelyn Grip, M.D.

## 2013-11-04 NOTE — Patient Instructions (Signed)
Physical Therapy Amitriptyline at night Water aerobics 2 to 3 days a week. 2 days a week do resistance exercise In 2 weeks restart the zetia. See me in 4 weeks as planned.

## 2013-12-01 ENCOUNTER — Ambulatory Visit: Admitting: Family Medicine

## 2013-12-02 ENCOUNTER — Ambulatory Visit (INDEPENDENT_AMBULATORY_CARE_PROVIDER_SITE_OTHER): Admitting: Family Medicine

## 2013-12-02 ENCOUNTER — Encounter: Payer: Self-pay | Admitting: Family Medicine

## 2013-12-02 VITALS — BP 136/80 | HR 90 | Temp 98.6°F | Ht 62.0 in | Wt 170.0 lb

## 2013-12-02 DIAGNOSIS — E559 Vitamin D deficiency, unspecified: Secondary | ICD-10-CM

## 2013-12-02 DIAGNOSIS — E785 Hyperlipidemia, unspecified: Secondary | ICD-10-CM

## 2013-12-02 DIAGNOSIS — N951 Menopausal and female climacteric states: Secondary | ICD-10-CM

## 2013-12-02 DIAGNOSIS — K219 Gastro-esophageal reflux disease without esophagitis: Secondary | ICD-10-CM

## 2013-12-02 DIAGNOSIS — IMO0001 Reserved for inherently not codable concepts without codable children: Secondary | ICD-10-CM

## 2013-12-02 DIAGNOSIS — R7309 Other abnormal glucose: Secondary | ICD-10-CM

## 2013-12-02 DIAGNOSIS — M791 Myalgia, unspecified site: Secondary | ICD-10-CM

## 2013-12-02 DIAGNOSIS — R739 Hyperglycemia, unspecified: Secondary | ICD-10-CM

## 2013-12-02 LAB — POCT GLYCOSYLATED HEMOGLOBIN (HGB A1C): Hemoglobin A1C: 6.1

## 2013-12-02 MED ORDER — AMITRIPTYLINE HCL 10 MG PO TABS: 10.0000 mg | ORAL_TABLET | Freq: Every day | ORAL | Status: DC

## 2013-12-02 NOTE — Progress Notes (Signed)
Patient ID: Shannon Mora, female   DOB: 12-Jun-1961, 53 y.o.   MRN: [id number] SUBJECTIVE: CC: Chief Complaint  Patient presents with  . 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 PT 2 x a week.  Sleeping great with the amitriptyline at nights.  Body mechanics discussed between the patient and PT and she realizes that her old reclining chair is making her neck sore.   .Patient is here for follow up of hyperlipidemia: denies Headache;denies Chest Pain;denies weakness;denies Shortness of Breath and orthopnea;denies Visual changes;denies palpitations;denies cough;denies pedal edema;denies symptoms of TIA or stroke;deniesClaudication symptoms. admits to Compliance with medications; denies Problems with medications.   Past Medical History  Diagnosis Date  . Hyperlipidemia   . Hyperglycemia   . HSV-1 (herpes simplex virus 1) infection   . Basal cell cancer     On forehead.  . Perimenopausal   . Vitamin D deficiency   . GERD (gastroesophageal reflux disease)    Past Surgical History  Procedure Laterality Date  . Breast enhancement surgery    . Tubal ligation    . Cholecystectomy    . Back surgery     History   Social History  . Marital Status: Married    Spouse Name: N/A    Number of Children: N/A  . Years of Education: N/A   Occupational History  . Not on file.   Social History Main Topics  . Smoking status: Never Smoker   . Smokeless tobacco: Not on file  . Alcohol Use: Not on file  . Drug Use: Not on file  . Sexual Activity: Not on file   Other Topics Concern  . Not on file   Social History Narrative  . No narrative on file   Family History  Problem Relation Age of Onset  . Heart disease Father    Current Outpatient Prescriptions on File Prior to Visit  Medication Sig Dispense Refill  . aspirin 81 MG EC tablet Take 81 mg by mouth daily.        Marland Kitchen CHIA SEED PO Take by mouth.      .  ezetimibe (ZETIA) 10 MG tablet Take 1 tablet (10 mg total) by mouth daily.  30 tablet  5  . fish oil-omega-3 fatty acids 1000 MG capsule Take 2 g by mouth 2 (two) times daily.        . Flaxseed, Linseed, (FLAX SEEDS PO) Take by mouth.      . Multiple Vitamin (MULTIVITAMIN) tablet Take 1 tablet by mouth daily.        . Multiple Vitamins-Minerals (EYE VITAMINS PO) Take 1 capsule by mouth daily.        . vitamin B-12 (CYANOCOBALAMIN) 500 MCG tablet Take 500 mcg by mouth daily.      . metFORMIN (GLUCOPHAGE) 500 MG tablet TAKE 1 TABLET BY MOUTH EVERY DAY  30 tablet  0  . pantoprazole (PROTONIX) 40 MG tablet Take 1 tablet (40 mg total) by mouth daily.  30 tablet  3   No current facility-administered medications on file prior to visit.   No Known Allergies Immunization History  Administered Date(s) Administered  . Hepatitis B 05/29/2000, 09/17/2000, 03/17/2001  . Td 04/07/1989, 05/26/2001, 08/25/2002   Prior to Admission medications   Medication Sig Start Date End Date Taking? Authorizing Provider  amitriptyline (ELAVIL) 10 MG tablet Take 1 tablet (10 mg total) by mouth at bedtime. 11/04/13  Yes Vernie Shanks, MD  aspirin 81 MG EC tablet Take 81 mg by mouth daily.     Yes Historical Provider, MD  CHIA SEED PO Take by mouth.   Yes Historical Provider, MD  ezetimibe (ZETIA) 10 MG tablet Take 1 tablet (10 mg total) by mouth daily. 08/01/13  Yes Vernie Shanks, MD  fish oil-omega-3 fatty acids 1000 MG capsule Take 2 g by mouth 2 (two) times daily.     Yes Historical Provider, MD  Flaxseed, Linseed, (FLAX SEEDS PO) Take by mouth.   Yes Historical Provider, MD  Multiple Vitamin (MULTIVITAMIN) tablet Take 1 tablet by mouth daily.     Yes Historical Provider, MD  Multiple Vitamins-Minerals (EYE VITAMINS PO) Take 1 capsule by mouth daily.     Yes Historical Provider, MD  vitamin B-12 (CYANOCOBALAMIN) 500 MCG tablet Take 500 mcg by mouth daily.   Yes Historical Provider, MD  metFORMIN (GLUCOPHAGE) 500 MG  tablet TAKE 1 TABLET BY MOUTH EVERY DAY 07/04/13   Vernie Shanks, MD  pantoprazole (PROTONIX) 40 MG tablet Take 1 tablet (40 mg total) by mouth daily. 07/20/13   Vernie Shanks, MD     ROS: As above in the HPI. All other systems are stable or negative.  OBJECTIVE: APPEARANCE:  Patient in no acute distress.The patient appeared well nourished and normally developed. Acyanotic. Waist: VITAL SIGNS:BP 136/80  Pulse 90  Temp(Src) 98.6 F (37 C) (Oral)  Ht $R'5\' 2"'ET$  (1.575 m)  Wt 170 lb (77.111 kg)  BMI 31.09 kg/m2  LMP 10/07/2013   SKIN: warm and  Dry without overt rashes, tattoos and scars  HEAD and Neck: without JVD, Head and scalp: normal Eyes:No scleral icterus. Fundi normal, eye movements normal. Ears: Auricle normal, canal normal, Tympanic membranes normal, insufflation normal. Nose: normal Throat: normal Neck & thyroid: normal  CHEST & LUNGS: Chest wall: normal Lungs: Clear  CVS: Reveals the PMI to be normally located. Regular rhythm, First and Second Heart sounds are normal,  absence of murmurs, rubs or gallops. Peripheral vasculature: Radial pulses: normal Dorsal pedis pulses: normal Posterior pulses: normal  ABDOMEN:  Appearance: normal Benign, no organomegaly, no masses, no Abdominal Aortic enlargement. No Guarding , no rebound. No Bruits. Bowel sounds: normal  RECTAL: N/A GU: N/A  EXTREMETIES: nonedematous.  MUSCULOSKELETAL:  Spine: normal Joints: intact  NEUROLOGIC: oriented to time,place and person; nonfocal. Strength is normal Sensory is normal Reflexes are normal Cranial Nerves are normal.  ASSESSMENT: Hyperlipidemia - Plan: CMP14+EGFR, NMR, lipoprofile  Hyperglycemia - Plan: POCT glycosylated hemoglobin (Hb A1C)  Vitamin D deficiency - Plan: Vit D  25 hydroxy (rtn osteoporosis monitoring)  Perimenopausal  GERD (gastroesophageal reflux disease)  Generalized muscle ache - Plan: amitriptyline (ELAVIL) 10 MG tablet  PLAN:  Patient  has made a 50% + improvement in her symptoms.with PT and amitriptyline.  Orders Placed This Encounter  Procedures  . CMP14+EGFR  . NMR, lipoprofile  . Vit D  25 hydroxy (rtn osteoporosis monitoring)  . POCT glycosylated hemoglobin (Hb A1C)   Meds ordered this encounter  Medications  . amitriptyline (ELAVIL) 10 MG tablet    Sig: Take 1 tablet (10 mg total) by mouth at bedtime.    Dispense:  30 tablet    Refill:  5   Medications Discontinued During This Encounter  Medication Reason  . amitriptyline (ELAVIL) 10 MG tablet Reorder   Return in about 3 months (around 03/03/2014) for Recheck medical problems.  Christiano Blandon P. Jacelyn Grip, M.D.

## 2013-12-03 LAB — CMP14+EGFR
ALT: 26 IU/L (ref 0–32)
AST: 29 IU/L (ref 0–40)
Albumin/Globulin Ratio: 1.8 (ref 1.1–2.5)
Albumin: 4.2 g/dL (ref 3.5–5.5)
Alkaline Phosphatase: 73 IU/L (ref 39–117)
BUN/Creatinine Ratio: 11 (ref 9–23)
BUN: 7 mg/dL (ref 6–24)
CO2: 24 mmol/L (ref 18–29)
Calcium: 9.5 mg/dL (ref 8.7–10.2)
Chloride: 99 mmol/L (ref 97–108)
Creatinine, Ser: 0.63 mg/dL (ref 0.57–1.00)
GFR calc Af Amer: 120 mL/min/{1.73_m2} (ref >59)
GFR calc non Af Amer: 104 mL/min/{1.73_m2} (ref >59)
Globulin, Total: 2.3 g/dL (ref 1.5–4.5)
Glucose: 88 mg/dL (ref 65–99)
Potassium: 4.1 mmol/L (ref 3.5–5.2)
Sodium: 140 mmol/L (ref 134–144)
Total Bilirubin: 0.3 mg/dL (ref 0.0–1.2)
Total Protein: 6.5 g/dL (ref 6.0–8.5)

## 2013-12-03 LAB — NMR, LIPOPROFILE
Cholesterol: 232 mg/dL — ABNORMAL HIGH (ref <200)
HDL Cholesterol by NMR: 45 mg/dL (ref >=40)
HDL Particle Number: 30.9 umol/L (ref >=30.5)
LDL Particle Number: 2524 nmol/L — ABNORMAL HIGH (ref <1000)
LDL Size: 20.3 nm — ABNORMAL LOW (ref >20.5)
LDLC SERPL CALC-MCNC: 138 mg/dL — ABNORMAL HIGH (ref <100)
LP-IR Score: 76 — ABNORMAL HIGH (ref <=45)
Small LDL Particle Number: 1610 nmol/L — ABNORMAL HIGH (ref <=527)
Triglycerides by NMR: 246 mg/dL — ABNORMAL HIGH (ref <150)

## 2013-12-03 LAB — VITAMIN D 25 HYDROXY (VIT D DEFICIENCY, FRACTURES): Vit D, 25-Hydroxy: 32.8 ng/mL (ref 30.0–100.0)

## 2013-12-23 ENCOUNTER — Telehealth: Payer: Self-pay | Admitting: Family Medicine

## 2013-12-26 NOTE — Telephone Encounter (Signed)
  Patient aware of labs. Wants to keep up with the diet and exercise.

## 2013-12-26 NOTE — Telephone Encounter (Signed)
Dr. Jacelyn Grip aware.

## 2016-06-30 ENCOUNTER — Other Ambulatory Visit: Payer: Self-pay | Admitting: Family Medicine

## 2016-06-30 ENCOUNTER — Ambulatory Visit
Admission: RE | Admit: 2016-06-30 | Discharge: 2016-06-30 | Disposition: A | Discharge status: Home / Self Care | Source: Ambulatory Visit | Attending: Family Medicine | Admitting: Family Medicine

## 2016-06-30 DIAGNOSIS — R093 Abnormal sputum: Secondary | ICD-10-CM

## 2017-09-03 ENCOUNTER — Ambulatory Visit: Admitting: Endocrinology

## 2017-10-23 ENCOUNTER — Encounter: Payer: Self-pay | Admitting: Internal Medicine

## 2017-10-23 ENCOUNTER — Ambulatory Visit (INDEPENDENT_AMBULATORY_CARE_PROVIDER_SITE_OTHER): Admitting: Internal Medicine

## 2017-10-23 VITALS — BP 140/76 | HR 92 | Ht 63.0 in | Wt 169.0 lb

## 2017-10-23 DIAGNOSIS — E782 Mixed hyperlipidemia: Secondary | ICD-10-CM | POA: Diagnosis not present

## 2017-10-23 DIAGNOSIS — E119 Type 2 diabetes mellitus without complications: Secondary | ICD-10-CM | POA: Diagnosis not present

## 2017-10-23 LAB — POCT GLYCOSYLATED HEMOGLOBIN (HGB A1C): HEMOGLOBIN A1C: 6.7

## 2017-10-23 MED ORDER — FLUVASTATIN SODIUM ER 80 MG PO TB24: 80.0000 mg | ORAL_TABLET | Freq: Every day | ORAL | 5 refills | Status: DC

## 2017-10-23 NOTE — Progress Notes (Signed)
Patient ID: Shannon Mora, female   DOB: [birth year], 57 y.o.   MRN: [id number]  HPI: Shannon Mora is a 57 y.o.-year-old female, referred by her PCP, Dr. Jacelyn Grip, for management of DM2, dx in 2015, prev. GDM 1989, 1992, non-insulin-dependent, controlled, without complications and also hyperlipidemia.  DM2: Last hemoglobin A1c was:  07/07/2017: HbA1c 6.8% 03/03/2017: HbA1c 6.5% 10/30/2016: HbA1c 6.6% Lab Results  Component Value Date   HGBA1C 6.1% 12/02/2013   HGBA1C 6.0% 07/20/2013   Pt is on a regimen of: - Metformin 500 mg daily in am  Pt is not checking sugars recently. - am: n/c - 2h after b'fast: n/c - before lunch: n/c - 2h after lunch: n/c - before dinner: n/c - 2h after dinner: n/c - bedtime: n/c - nighttime: n/c Lowest sugar was 80s;? hypoglycemia awareness. Highest sugar was 100s.  Glucometer: AccuCheck  Pt's meals are: - Breakfast: old fashion oatmeal or egg whites + toast - Lunch: protein shake - Dinner: vegetarian dish or fish once a week, occasional poultry - Snacks: 1-2  - no CKD, last BUN/creatinine:  07/07/2017: Glucose 108, BUN/creatinine 14/0.75 Lab Results  Component Value Date   BUN 7 12/02/2013   BUN 17 07/20/2013   CREATININE 0.63 12/02/2013   CREATININE 0.64 07/20/2013  - last eye exam was in Spring 2018. No DR. + macular degeneration. - no numbness and tingling in her feet.  Pt has FH of DM in brother.  She also has hyperlipidemia: - last set of lipids: 07/07/2017: 264/167/43/187 03/03/2017: 239/158/43/165  10/30/2016: 128/149/47/51 06/30/2016: 237/308/41/134 Lab Results  Component Value Date   CHOL 232 (H) 12/02/2013   HDL 45 12/02/2013   LDLCALC 138 (H) 12/02/2013   TRIG 246 (H) 12/02/2013  She was on ezetimibe >> mm pain She is intolerant to statins: tried Lipitor, low-dose Crestor >> mm pain.  However, Crestor worked great for her >> LDL decreased to 51.  She tried Fenofibrate and also Cholestyramine >> did not work. PCP is wondering  whether she would benefit from PCSK 9 inhibitor. Continues on CoQ10. Of note, hypothyroidism and her most recent vitamin D level was normal.  However, last level Jul 08, 2015, at 39.  Father died in his 58s from heart ds. Multiple relatives on father's side >> HL, heart ds.  ROS: Constitutional: no weight gain/loss, no fatigue, + hot flushes, + poor sleep Eyes: no blurry vision, no xerophthalmia ENT: no sore throat, no nodules palpated in throat, no dysphagia/odynophagia, no hoarseness Cardiovascular: no CP/SOB/palpitations/leg swelling Respiratory: no cough/SOB Gastrointestinal: no N/V/D/C Musculoskeletal: + muscle/+ joint aches Skin: no rashes, + easy bruising Neurological: no tremors/numbness/tingling/dizziness Psychiatric: no depression/anxiety + low libido  Surgeries: - tubal ligation sx 1992 - gall bladder sx - low lumbar sx  Past Medical History:  Diagnosis Date  . Basal cell cancer    On forehead.  Marland Kitchen GERD (gastroesophageal reflux disease)   . HSV-1 (herpes simplex virus 1) infection   . Hyperglycemia   . Hyperlipidemia   . Perimenopausal   . Vitamin D deficiency    Social History   Socioeconomic History  . Marital status: Married    Spouse name: Not on file  . Number of children: 2  Social Needs  Occupational History  . Not on file  Tobacco Use  . Smoking status: Never Smoker  . Smokeless tobacco: Never Used  Substance and Sexual Activity  . Alcohol use: Yes    Alcohol/week: 0.6 oz    Types: 1 Glasses of hard cider per mo  Comment: monthly  . Drug use: No   Current Outpatient Medications on File Prior to Visit  Medication Sig Dispense Refill  . aspirin 81 MG EC tablet Take 81 mg by mouth daily.      Marland Kitchen CHIA SEED PO Take by mouth.    . fish oil-omega-3 fatty acids 1000 MG capsule Take 2 g by mouth 2 (two) times daily.      . Flaxseed, Linseed, (FLAX SEEDS PO) Take by mouth.    . metFORMIN (GLUCOPHAGE) 500 MG tablet TAKE 1 TABLET BY MOUTH EVERY DAY 30  tablet 0  . Multiple Vitamin (MULTIVITAMIN) tablet Take 1 tablet by mouth daily.      . Multiple Vitamins-Minerals (EYE VITAMINS PO) Take 1 capsule by mouth daily.      . pantoprazole (PROTONIX) 40 MG tablet Take 1 tablet (40 mg total) by mouth daily. 30 tablet 3  . vitamin B-12 (CYANOCOBALAMIN) 500 MCG tablet Take 500 mcg by mouth daily.    Marland Kitchen amitriptyline (ELAVIL) 10 MG tablet Take 1 tablet (10 mg total) by mouth at bedtime. (Patient not taking: Reported on 10/23/2017) 30 tablet 5  . ezetimibe (ZETIA) 10 MG tablet Take 1 tablet (10 mg total) by mouth daily. (Patient not taking: Reported on 10/23/2017) 30 tablet 5   No current facility-administered medications on file prior to visit.    No Known Allergies Family History  Problem Relation Age of Onset  . Heart disease Father     PE: BP 140/76   Pulse 92   Ht 5\' 3"  (1.6 m)   Wt 169 lb (76.7 kg)   SpO2 96%   BMI 29.94 kg/m  Wt Readings from Last 3 Encounters:  10/23/17 169 lb (76.7 kg)  12/02/13 170 lb (77.1 kg)  11/04/13 167 lb (75.8 kg)   Constitutional: overweight, in NAD Eyes: PERRLA, EOMI, no exophthalmos ENT: moist mucous membranes, no thyromegaly, no cervical lymphadenopathy Cardiovascular: tachycardia, RR, No MRG Respiratory: CTA B Gastrointestinal: abdomen soft, NT, ND, BS+ Musculoskeletal: no deformities, strength intact in all 4 Skin: moist, warm, no rashes Neurological: no tremor with outstretched hands, DTR normal in all 4  ASSESSMENT: 1. DM2, non-insulin-dependent, controlled, without long term complications  2. HL  PLAN:  1. Patient with several years of controlled diabetes, on oral antidiabetic regimen with minimal Metformin dose. She is not checking sugars at home >> advised to start.  We checked an HbA1c today for her and this was slightly lower, at 6.7%.  Therefore, no changes are needed in her medication regimen, but we did discuss about the need to improve her diet.  I discouraged the keto diet and I  strongly encouraged her to follow a plant-based diet.  Given references. - I suggested to:  Patient Instructions  Please start Fluvastatin XL 80 daily. Let me know how it goes.  Please continue Metformin 500 mg daily.  .Please look up: Rip Esselstyn - The Engine 2 diet   Rip Esselstyn - The Engine 2 7-day Rescue diet  Please return in 3 months with your sugar log.   - Strongly advised her to start checking sugars at different times of the day - check 0-1 times a day, rotating checks - Advised to bring her meter at next - given foot care handout and explained the principles  - given instructions for hypoglycemia management "15-15 rule"  - advised for yearly eye exams >> she is up-to-date - Return to clinic in 3 mo with sugar log   2. 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 -She was intolerant to Lipitor and Crestor, even low dose.  She tried Crestor twice, with the same increased muscle aches, despite an impressive improvement in her LDL, to 51.  She also tried fibroids and bile acids sequestrants but these did not work.  -We discussed about trying fluvastatin XL which is metabolized through another cytochrome and it may not give her the same side effects.  If she cannot tolerate this, she needs PCSK 9 inhibitors.  In that case, since I do not usually use them in the clinic due to very high cost problems with having them covered by the insurance, she agrees with a referral to lipid clinic. -A plant-based diet will greatly help decrease her lipid levels, also.  Philemon Kingdom, MD PhD [facility]

## 2017-10-23 NOTE — Addendum Note (Signed)
Addended by: Nile Riggs on: 10/23/2017 12:56 PM   Modules accepted: Orders

## 2017-10-23 NOTE — Patient Instructions (Addendum)
Please start Fluvastatin XL 80 daily. Let me know how it goes.  Please continue Metformin 500 mg daily.  .Please look up: Rip Esselstyn - The Engine 2 diet   Rip Esselstyn - The Engine 2 7-day Rescue diet  Please return in 3 months with your sugar log.   PATIENT INSTRUCTIONS FOR TYPE 2 DIABETES:  **Please join MyChart!** - see attached instructions about how to join if you have not done so already.  DIET AND EXERCISE Diet and exercise is an important part of diabetic treatment.  We recommended aerobic exercise in the form of brisk walking (working between 40-60% of maximal aerobic capacity, similar to brisk walking) for 150 minutes per week (such as 30 minutes five days per week) along with 3 times per week performing 'resistance' training (using various gauge rubber tubes with handles) 5-10 exercises involving the major muscle groups (upper body, lower body and core) performing 10-15 repetitions (or near fatigue) each exercise. Start at half the above goal but build slowly to reach the above goals. If limited by weight, joint pain, or disability, we recommend daily walking in a swimming pool with water up to waist to reduce pressure from joints while allow for adequate exercise.    BLOOD GLUCOSES Monitoring your blood glucoses is important for continued management of your diabetes. Please check your blood glucoses 2-4 times a day: fasting, before meals and at bedtime (you can rotate these measurements - e.g. one day check before the 3 meals, the next day check before 2 of the meals and before bedtime, etc.).   HYPOGLYCEMIA (low blood sugar) Hypoglycemia is usually a reaction to not eating, exercising, or taking too much insulin/ other diabetes drugs.  Symptoms include tremors, sweating, hunger, confusion, headache, etc. Treat IMMEDIATELY with 15 grams of Carbs: . 4 glucose tablets .  cup regular juice/soda . 2 tablespoons raisins . 4 teaspoons sugar . 1 tablespoon honey Recheck blood  glucose in 15 mins and repeat above if still symptomatic/blood glucose <100.  RECOMMENDATIONS TO REDUCE YOUR RISK OF DIABETIC COMPLICATIONS: * Take your prescribed MEDICATION(S) * Follow a DIABETIC diet: Complex carbs, fiber rich foods, (monounsaturated and polyunsaturated) fats * AVOID saturated/trans fats, high fat foods, >2,300 mg salt per day. * EXERCISE at least 5 times a week for 30 minutes or preferably daily.  * DO NOT SMOKE OR DRINK more than 1 drink a day. * Check your FEET every day. Do not wear tightfitting shoes. Contact us if you develop an ulcer * See your EYE doctor once a year or more if needed * Get a FLU shot once a year * Get a PNEUMONIA vaccine once before and once after age 51 years  GOALS:  * Your Hemoglobin A1c of <7%  * fasting sugars need to be <130 * after meals sugars need to be <180 (2h after you start eating) * Your Systolic BP should be 166 or lower  * Your Diastolic BP should be 80 or lower  * Your HDL (Good Cholesterol) should be 40 or higher  * Your LDL (Bad Cholesterol) should be 100 or lower. * Your Triglycerides should be 150 or lower  * Your Urine microalbumin (kidney function) should be <30 * Your Body Mass Index should be 25 or lower    Please consider the following ways to cut down carbs and fat and increase fiber and micronutrients in your diet: - substitute whole grain for white bread or pasta - substitute brown rice for white rice - substitute  90-calorie flat bread pieces for slices of bread when possible - substitute sweet potatoes or yams for white potatoes - substitute humus for margarine - substitute tofu for cheese when possible - substitute almond or rice milk for regular milk (would not drink soy milk daily due to concern for soy estrogen influence on breast cancer risk) - substitute dark chocolate for other sweets when possible - substitute water - can add lemon or orange slices for taste - for diet sodas (artificial sweeteners  will trick your body that you can eat sweets without getting calories and will lead you to overeating and weight gain in the long run) - do not skip breakfast or other meals (this will slow down the metabolism and will result in more weight gain over time)  - can try smoothies made from fruit and almond/rice milk in am instead of regular breakfast - can also try old-fashioned (not instant) oatmeal made with almond/rice milk in am - order the dressing on the side when eating salad at a restaurant (pour less than half of the dressing on the salad) - eat as little meat as possible - can try juicing, but should not forget that juicing will get rid of the fiber, so would alternate with eating raw veg./fruits or drinking smoothies - use as little oil as possible, even when using olive oil - can dress a salad with a mix of balsamic vinegar and lemon juice, for e.g. - use agave nectar, stevia sugar, or regular sugar rather than artificial sweateners - steam or broil/roast veggies  - snack on veggies/fruit/nuts (unsalted, preferably) when possible, rather than processed foods - reduce or eliminate aspartame in diet (it is in diet sodas, chewing gum, etc) Read the labels!  Try to read Dr. Janene Harvey book: "Program for Reversing Diabetes" for other ideas for healthy eating.

## 2017-10-27 ENCOUNTER — Encounter: Payer: Self-pay | Admitting: Internal Medicine

## 2017-10-30 ENCOUNTER — Encounter: Payer: Self-pay | Admitting: Internal Medicine

## 2017-11-04 ENCOUNTER — Encounter: Payer: Self-pay | Admitting: Internal Medicine

## 2017-11-05 ENCOUNTER — Other Ambulatory Visit: Payer: Self-pay

## 2017-11-05 MED ORDER — GLUCOSE BLOOD VI STRP: ORAL_STRIP | 12 refills | Status: DC

## 2017-11-05 MED ORDER — FREESTYLE LANCETS MISC: 12 refills | Status: DC

## 2017-11-12 ENCOUNTER — Encounter: Payer: Self-pay | Admitting: Internal Medicine

## 2017-11-13 ENCOUNTER — Other Ambulatory Visit: Payer: Self-pay

## 2017-11-13 MED ORDER — GLUCOSE BLOOD VI STRP: ORAL_STRIP | 12 refills | Status: DC

## 2017-11-13 MED ORDER — FLUVASTATIN SODIUM ER 80 MG PO TB24: 80.0000 mg | ORAL_TABLET | Freq: Every day | ORAL | 1 refills | Status: DC

## 2017-11-13 MED ORDER — FREESTYLE LANCETS MISC: 12 refills | Status: DC

## 2017-12-02 ENCOUNTER — Encounter: Payer: Self-pay | Admitting: Internal Medicine

## 2018-01-28 ENCOUNTER — Ambulatory Visit (INDEPENDENT_AMBULATORY_CARE_PROVIDER_SITE_OTHER): Admitting: Internal Medicine

## 2018-01-28 ENCOUNTER — Encounter: Payer: Self-pay | Admitting: Internal Medicine

## 2018-01-28 VITALS — BP 138/90 | HR 74 | Ht 63.0 in | Wt 164.4 lb

## 2018-01-28 DIAGNOSIS — E663 Overweight: Secondary | ICD-10-CM

## 2018-01-28 DIAGNOSIS — E785 Hyperlipidemia, unspecified: Secondary | ICD-10-CM

## 2018-01-28 DIAGNOSIS — E119 Type 2 diabetes mellitus without complications: Secondary | ICD-10-CM | POA: Diagnosis not present

## 2018-01-28 LAB — POCT GLYCOSYLATED HEMOGLOBIN (HGB A1C): HEMOGLOBIN A1C: 6.6 % — AB (ref 4.0–5.6)

## 2018-01-28 NOTE — Addendum Note (Signed)
Addended by: Drucilla Schmidt on: 01/28/2018 11:53 AM   Modules accepted: Orders

## 2018-01-28 NOTE — Progress Notes (Signed)
Patient ID: Shannon Mora, female   DOB: Jul 31, 1961, 57 y.o.   MRN: [id number]  HPI: Shannon Mora is a 57 y.o.-year-old female, returning for follow-up for DM2, dx in 2015, prev. GDM 1989, 1992, non-insulin-dependent, controlled, without complications and also hyperlipidemia.  Last visit 3 months ago.  Since last visit, after our last visits discussion, she switched to a whole food plant-based diet 3 weeks ago.  She already lost 5 pounds and feels better.  Sugars have improved.  Her husband also adopted the same diet.  DM2: Last hemoglobin A1c was:  Lab Results  Component Value Date   HGBA1C 6.7 10/23/2017   HGBA1C 6.1% 12/02/2013   HGBA1C 6.0% 07/20/2013  07/07/2017: HbA1c 6.8% 03/03/2017: HbA1c 6.5% 10/30/2016: HbA1c 6.6%  Pt is on a regimen of: - Metformin 500 mg daily in a.m.  She is checking her sugars once a day: - am: n/c >> 89-127 - 2h after b'fast: n/c - before lunch: n/c >> 83-109 - 2h after lunch: n/c >> 124 - before dinner: n/c >> 95 - 2h after dinner: n/c - bedtime: n/c - nighttime: n/c Lowest sugar was 80s >> 83; it is unclear at which level she has hypoglycemia awareness. Highest sugar was 100s >> 168.  Glucometer: AccuCheck  Pt's meals are: - Breakfast: old fashion oatmeal or egg whites + toast - Lunch: protein shake - Dinner: vegetarian dish or fish once a week, occasional poultry - Snacks: 1-2  - No CKD, last BUN/creatinine:  07/07/2017: Glucose 108, BUN/creatinine 14/0.75 Lab Results  Component Value Date   BUN 7 12/02/2013   BUN 17 07/20/2013   CREATININE 0.63 12/02/2013   CREATININE 0.64 07/20/2013  - last eye exam was in spring 2018: No DR. she has macular degeneration - No numbness and tingling in her feet.  Pt has FH of DM in brother.  Hyperlipidemia: -+ Significant hyperlipidemia; last set of lipids: 07/07/2017: 264/167/43/187 03/03/2017: 239/158/43/165  10/30/2016: 128/149/47/51 06/30/2016: 237/308/41/134 Lab Results  Component Value  Date   CHOL 232 (H) 12/02/2013   HDL 45 12/02/2013   LDLCALC 138 (H) 12/02/2013   TRIG 246 (H) 12/02/2013  Since last visit, we started fluvastatin XL.  We had to do a preauthorization, but this was approved.  However, she had fatigue, muscle aches, and joint aches even when taking it every 3 days.  She stopped 3 weeks ago. She was on ezetimibe >> mm pain She is intolerant to statins: tried Lipitor, low-dose Crestor >> mm pain.  However, Crestor worked great for her >> LDL decreased to 51.  She tried Fenofibrate and also Cholestyramine >> did not work. Continues on CoQ10. Of note, she does not have hypothyroidism and her most recent vitamin D level was normal.  Father died in his 63s from heart ds. Multiple relatives on father's side >> HL, heart ds.  Started Celexa recenty.   ROS: Constitutional:+ weight loss, no fatigue, no subjective hyperthermia, no subjective hypothermia Eyes: no blurry vision, no xerophthalmia ENT: no sore throat, no nodules palpated in throat, no dysphagia, no odynophagia, no hoarseness Cardiovascular: no CP/no SOB/no palpitations/no leg swelling Respiratory: no cough/no SOB/no wheezing Gastrointestinal: no N/no V/no D/no C/no acid reflux Musculoskeletal: no muscle aches/no joint aches Skin: no rashes, no hair loss Neurological: no tremors/no numbness/no tingling/no dizziness  I reviewed pt's medications, allergies, PMH, social hx, family hx, and changes were documented in the history of present illness. Otherwise, unchanged from my initial visit note.  Surgeries: - tubal ligation sx 1992 - gall  bladder sx - low lumbar sx  Past Medical History:  Diagnosis Date  . Basal cell cancer    On forehead.  Marland Kitchen GERD (gastroesophageal reflux disease)   . HSV-1 (herpes simplex virus 1) infection   . Hyperglycemia   . Hyperlipidemia   . Perimenopausal   . Vitamin D deficiency    Social History   Socioeconomic History  . Marital status: Married    Spouse name:  Not on file  . Number of children: 2  Social Needs  Occupational History  . Not on file  Tobacco Use  . Smoking status: Never Smoker  . Smokeless tobacco: Never Used  Substance and Sexual Activity  . Alcohol use: Yes    Alcohol/week: 0.6 oz    Types: 1 Glasses of hard cider per mo    Comment: monthly  . Drug use: No   Current Outpatient Medications on File Prior to Visit  Medication Sig Dispense Refill  . amitriptyline (ELAVIL) 10 MG tablet Take 1 tablet (10 mg total) by mouth at bedtime. 30 tablet 5  . aspirin 81 MG EC tablet Take 81 mg by mouth daily.      Marland Kitchen CHIA SEED PO Take by mouth.    . Coenzyme Q10 (CO Q10) 200 MG CAPS Co Q-10 200 mg capsule  Take by oral route.    . Coral Calcium (SM CORAL CALCIUM) 1000 (390 Ca) MG TABS Take by mouth.    . ezetimibe (ZETIA) 10 MG tablet Take 1 tablet (10 mg total) by mouth daily. 30 tablet 5  . fish oil-omega-3 fatty acids 1000 MG capsule Take 2 g by mouth 2 (two) times daily.      . Flaxseed, Linseed, (FLAX SEEDS PO) Take by mouth.    . fluvastatin XL (LESCOL XL) 80 MG 24 hr tablet Take 1 tablet (80 mg total) by mouth daily. 90 tablet 1  . glucose blood (FREESTYLE LITE) test strip Use to check blood sugars one time daily 100 each 12  . Lancets (FREESTYLE) lancets Use to check blood sugars one time daily 100 each 12  . metFORMIN (GLUCOPHAGE) 500 MG tablet TAKE 1 TABLET BY MOUTH EVERY DAY 30 tablet 0  . Multiple Vitamin (MULTIVITAMIN) tablet Take 1 tablet by mouth daily.      . Multiple Vitamins-Minerals (EYE VITAMINS PO) Take 1 capsule by mouth daily.      . pantoprazole (PROTONIX) 40 MG tablet Take 1 tablet (40 mg total) by mouth daily. 30 tablet 3  . vitamin B-12 (CYANOCOBALAMIN) 500 MCG tablet Take 500 mcg by mouth daily.     No current facility-administered medications on file prior to visit.    No Known Allergies Family History  Problem Relation Age of Onset  . Heart disease Father     PE: BP 138/90   Pulse 74   Ht 5\' 3"   (1.6 m)   Wt 164 lb 6.4 oz (74.6 kg)   LMP 10/07/2013   SpO2 98%   BMI 29.12 kg/m  Wt Readings from Last 3 Encounters:  01/28/18 164 lb 6.4 oz (74.6 kg)  10/23/17 169 lb (76.7 kg)  12/02/13 170 lb (77.1 kg)   Constitutional: overweight, in NAD Eyes: PERRLA, EOMI, no exophthalmos ENT: moist mucous membranes, no thyromegaly, no cervical lymphadenopathy Cardiovascular: RRR, No MRG Respiratory: CTA B Gastrointestinal: abdomen soft, NT, ND, BS+ Musculoskeletal: no deformities, strength intact in all 4 Skin: moist, warm, no rashes Neurological: no tremor with outstretched hands, DTR normal in all 4  ASSESSMENT: 1. DM2, non-insulin-dependent, controlled, without long term complications  2. HL  3.  Overweight  PLAN:  1. Patient with several years of controlled diabetes, on oral antidiabetic regimen with minimal metformin dose.  At last visit, she was not checking sugars at home and I advised her to start.  At that time, her HbA1c was slightly lower, is 6.7%.  I did not change her medication regimen but we did discuss about the need to improve her diet.  I discouraged a keto diet and strongly encouraged her to follow a plant-based diet.  I gave her references. - She started this diet 3 weeks ago and she already lost 5 pounds and feels better - Her sugars are also better  on a minimum dose of metformin.  We will continue with this and I strongly advised her to continue with the diet. - I suggested to:  Patient Instructions  Please continue Metformin 500 mg daily.  Please return in 3-4 months with your sugar log.   Please look up the following book:   - today, HbA1c is 6.6% (stable) - continue checking sugars at different times of the day - check 1x a day, rotating checks - advised for yearly eye exams >> she is UTD - Return to clinic in 3-4 mo with sugar log    2. 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  Previous intolerance to Lipitor and Crestor, even at low doses, despite improvement of her lipid levels.  She had increased muscle aches with Crestor.  She tried fenofibrate and bile acid sequestrant but these did not work for her. - At last visit, I suggested fluvastatin XL, which is metabolized through another cytochrome and has minimal muscle side effects.  We discussed that if she could not tolerate this, she will need a PCSK9 inhibitor and a referral to the lipid clinic.  I also advised her at that time that the plant-based diet will greatly help decrease her cholesterol levels also. - At this visit, she tells me that she could not tolerate the fluvastatin XL even t taking it every 3 days. However, since she started the plant-based diet,we will wait for her to be on this diet for 3 to 4 months before rechecking her cholesterol levels.  If they are not significantly changed, I will refer her to the lipid clinic for [place] or [place].  3.  Overweight - Doing excellent on a plant-based diet, she lost 5 pounds in the last 3 weeks and she does not feel that the diet is very difficult to follow.  Philemon Kingdom, MD PhD [facility] Endocrinology

## 2018-01-28 NOTE — Patient Instructions (Addendum)
Please continue Metformin 500 mg daily.  Please return in 3-4 months with your sugar log.   Please look up the following book:

## 2018-06-07 ENCOUNTER — Ambulatory Visit (INDEPENDENT_AMBULATORY_CARE_PROVIDER_SITE_OTHER): Admitting: Internal Medicine

## 2018-06-07 ENCOUNTER — Encounter: Payer: Self-pay | Admitting: Internal Medicine

## 2018-06-07 VITALS — BP 130/80 | HR 78 | Ht 63.0 in | Wt 166.0 lb

## 2018-06-07 DIAGNOSIS — E663 Overweight: Secondary | ICD-10-CM | POA: Diagnosis not present

## 2018-06-07 DIAGNOSIS — E785 Hyperlipidemia, unspecified: Secondary | ICD-10-CM

## 2018-06-07 DIAGNOSIS — Z23 Encounter for immunization: Secondary | ICD-10-CM

## 2018-06-07 DIAGNOSIS — E119 Type 2 diabetes mellitus without complications: Secondary | ICD-10-CM | POA: Diagnosis not present

## 2018-06-07 LAB — MICROALBUMIN / CREATININE URINE RATIO
CREATININE, U: 218.2 mg/dL
MICROALB UR: 1.7 mg/dL (ref 0.0–1.9)
Microalb Creat Ratio: 0.8 mg/g (ref 0.0–30.0)

## 2018-06-07 LAB — LIPID PANEL
Cholesterol: 233 mg/dL — ABNORMAL HIGH (ref 0–200)
HDL: 38.5 mg/dL — AB (ref >39.00)
NonHDL: 194.34
TRIGLYCERIDES: 378 mg/dL — AB (ref 0.0–149.0)
Total CHOL/HDL Ratio: 6
VLDL: 75.6 mg/dL — AB (ref 0.0–40.0)

## 2018-06-07 LAB — COMPLETE METABOLIC PANEL WITH GFR
AG RATIO: 1.5 (calc) (ref 1.0–2.5)
ALT: 11 U/L (ref 6–29)
AST: 15 U/L (ref 10–35)
Albumin: 4 g/dL (ref 3.6–5.1)
Alkaline phosphatase (APISO): 57 U/L (ref 33–130)
BILIRUBIN TOTAL: 0.4 mg/dL (ref 0.2–1.2)
BUN: 15 mg/dL (ref 7–25)
CHLORIDE: 106 mmol/L (ref 98–110)
CO2: 26 mmol/L (ref 20–32)
Calcium: 9.2 mg/dL (ref 8.6–10.4)
Creat: 0.74 mg/dL (ref 0.50–1.05)
GFR, Est African American: 105 mL/min/{1.73_m2} (ref >=60)
GFR, Est Non African American: 91 mL/min/{1.73_m2} (ref >=60)
GLUCOSE: 117 mg/dL — AB (ref 65–99)
Globulin: 2.7 g/dL (calc) (ref 1.9–3.7)
POTASSIUM: 4.2 mmol/L (ref 3.5–5.3)
Sodium: 141 mmol/L (ref 135–146)
TOTAL PROTEIN: 6.7 g/dL (ref 6.1–8.1)

## 2018-06-07 LAB — LDL CHOLESTEROL, DIRECT: Direct LDL: 166 mg/dL

## 2018-06-07 LAB — POCT GLYCOSYLATED HEMOGLOBIN (HGB A1C): Hemoglobin A1C: 6.4 % — AB (ref 4.0–5.6)

## 2018-06-07 NOTE — Progress Notes (Signed)
Patient ID: Shannon Mora, female   DOB: 11/15/1960, 57 y.o.   MRN: [id number]  HPI: Shannon Mora is a 58 y.o.-year-old female, returning for follow-up for DM2, dx in 2015, prev. GDM 1989, 1992, non-insulin-dependent, controlled, without long-term complications and also hyperlipidemia.  Last visit 4 months ago.  She continues on a mostly plant-based diet.  Sugars continue to remain well controlled.  DM2: Last hemoglobin A1c was:  Lab Results  Component Value Date   HGBA1C 6.6 (A) 01/28/2018   HGBA1C 6.7 10/23/2017   HGBA1C 6.1% 12/02/2013  07/07/2017: HbA1c 6.8% 03/03/2017: HbA1c 6.5% 10/30/2016: HbA1c 6.6%  Patient is on:: - Metformin 500 mg daily in a.m.  She is checking her sugars 1x a day: - am: n/c >> 89-127 >> 78-81 - 2h after b'fast: n/c >> 130 - before lunch: n/c >> 83-109 >> n/c - 2h after lunch: n/c >> 124 >> 114 - before dinner: n/c >> 95 >> n/c - 2h after dinner: n/c >> 180 - bedtime: n/c - nighttime: n/c Lowest sugar was 80s >> 83 >> 78  ; it is unclear at which level she has hypoglycemia awareness. Highest sugar was 100s >> 168 >> 180.  Glucometer: AccuCheck  Pt's meals are: - Breakfast: old fashion oatmeal or egg whites + toast - Lunch: protein shake - Dinner: vegetarian dish or fish once a week, occasional poultry - Snacks: 1-2  -No CKD, last BUN/creatinine:  07/07/2017: Glucose 108, BUN/creatinine 14/0.75 Lab Results  Component Value Date   BUN 7 12/02/2013   BUN 17 07/20/2013   CREATININE 0.63 12/02/2013   CREATININE 0.64 07/20/2013  - last eye exam was in spring 2018: No DR.  + Macular degeneration. - No numbness and tingling in her feet.  Pt has FH of DM in brother.  Hyperlipidemia: -+ Significant hyperlipidemia.  Latest sets of lipids reviewed:  07/07/2017: 264/167/43/187 03/03/2017: 239/158/43/165  10/30/2016: 128/149/47/51 06/30/2016: 237/308/41/134 Lab Results  Component Value Date   CHOL 232 (H) 12/02/2013   HDL 45 12/02/2013   LDLCALC  138 (H) 12/02/2013   TRIG 246 (H) 12/02/2013  We tried fluvastatin XL (after preauthorization).  She could not tolerate this due to muscle aches and joint aches even when she was taking it every 3 days. She was on ezetimibe >> mm pain She is intolerant to statins: tried Lipitor, low-dose Crestor >> mm pain.  However, Crestor worked great for her >> LDL decreased to 51.  She tried Fenofibrate and also Cholestyramine >> did not work. Continues on CoQ10. Of note, she does not have hypothyroidism and her most recent vitamin D level was normal.  Father died in his 42s from heart ds. Multiple relatives on father's side >> HL, heart ds.  On Celexa.  ROS: Constitutional: no weight gain/no weight loss, no fatigue, + subjective hyperthermia, no subjective hypothermia, + nocturia Eyes: no blurry vision, no xerophthalmia ENT: no sore throat, no nodules palpated in throat, no dysphagia, no odynophagia, no hoarseness Cardiovascular: no CP/no SOB/no palpitations/no leg swelling Respiratory: no cough/no SOB/no wheezing Gastrointestinal: no N/no V/no D/no C/no acid reflux Musculoskeletal: + muscle aches/no joint aches Skin: + rash (allergic rxn), no hair loss Neurological: no tremors/no numbness/no tingling/no dizziness  I reviewed pt's medications, allergies, PMH, social hx, family hx, and changes were documented in the history of present illness. Otherwise, unchanged from my initial visit note.  Surgeries: - tubal ligation sx 1992 - gall bladder sx - low lumbar sx  Past Medical History:  Diagnosis Date  .  Basal cell cancer    On forehead.  Marland Kitchen GERD (gastroesophageal reflux disease)   . HSV-1 (herpes simplex virus 1) infection   . Hyperglycemia   . Hyperlipidemia   . Perimenopausal   . Vitamin D deficiency    Social History   Socioeconomic History  . Marital status: Married    Spouse name: Not on file  . Number of children: 2  Social Needs  Occupational History  . Not on file   Tobacco Use  . Smoking status: Never Smoker  . Smokeless tobacco: Never Used  Substance and Sexual Activity  . Alcohol use: Yes    Alcohol/week: 0.6 oz    Types: 1 Glasses of hard cider per mo    Comment: monthly  . Drug use: No   Current Outpatient Medications on File Prior to Visit  Medication Sig Dispense Refill  . Calcium Carbonate-Vit D-Min (CALCIUM 1200 PO) Take 1,000 mg by mouth.    . Cholecalciferol (VITAMIN D3) 2000 units TABS Take by mouth.    . Coenzyme Q10 (CO Q10) 200 MG CAPS Co Q-10 200 mg capsule  Take by oral route.    Marland Kitchen glucose blood (FREESTYLE LITE) test strip Use to check blood sugars one time daily 100 each 12  . Lancets (FREESTYLE) lancets Use to check blood sugars one time daily 100 each 12  . metFORMIN (GLUCOPHAGE) 500 MG tablet TAKE 1 TABLET BY MOUTH EVERY DAY 30 tablet 0  . Multiple Vitamins-Minerals (EYE VITAMINS PO) Take 1 capsule by mouth daily.       No current facility-administered medications on file prior to visit.    No Known Allergies Family History  Problem Relation Age of Onset  . Heart disease Father     PE: BP 130/80   Pulse 78   Ht 5\' 3"  (1.6 m)   Wt 166 lb (75.3 kg)   LMP 10/07/2013   SpO2 97%   BMI 29.41 kg/m  Wt Readings from Last 3 Encounters:  06/07/18 166 lb (75.3 kg)  01/28/18 164 lb 6.4 oz (74.6 kg)  10/23/17 169 lb (76.7 kg)   Constitutional: overweight, in NAD Eyes: PERRLA, EOMI, no exophthalmos ENT: moist mucous membranes, no thyromegaly, no cervical lymphadenopathy Cardiovascular: RRR, No MRG Respiratory: CTA B Gastrointestinal: abdomen soft, NT, ND, BS+ Musculoskeletal: no deformities, strength intact in all 4 Skin: moist, warm, no rashes Neurological: no tremor with outstretched hands, DTR normal in all 4  ASSESSMENT: 1. DM2, non-insulin-dependent, controlled, without long term complications  2. HL  3.  Overweight  PLAN:  1. Patient with several years of controlled diabetes, on oral antidiabetic  regimen, with minimal metformin dose.  We discussed at length in the past about improving her diet and 3 weeks before last visit she started a plant-based diet.  She already saw significant improvement in her sugars and lost 5 pounds before last visit.  At that time, HbA1c was slightly lower, at 6.6%. - At this visit, sugars continue to be well controlled, as she continues on her mostly plant-based diet.  She has few dietary indiscretions, but overall, she likes the diet and does not find it hard to keep.  I strongly advised her to continue on the diet.  We will also continue her low-dose metformin. - I suggested to:  Patient Instructions  Please continue metformin 500 mg daily  Please return in 6 months with your sugar log.   - today, HbA1c is 6.4% (improved) - continue checking sugars at different times of  the day - check 1x a day, rotating checks - advised for yearly eye exams >> she is not UTD - Flu shot today - We will check annual labs today - Return to clinic in 6 mo with sugar log    2. 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  She has previous intolerance to Lipitor and Crestor, even at low doses, despite improvement of her lipid panels.  She had increased muscle aches with Crestor.  She also try fenofibrate and bile acid sequestrants but these did not work for her.  We also tried fluvastatin XL which is metabolized for another cytochrome and has minimal muscle side effects, but she could not tolerate this even if she was taking it every 3 days.  Since at last visit she was on a plant-based diet, we discussed about waiting for several months on the diet before rechecking her cholesterol levels, since I suspect that this will greatly improved on the diet.  If not, she will need a referral to the lipid clinic for [place] or [place].  She agrees with this.  3.  Overweight -Her weight started to improve after  starting a plant-based diet. -Given her references at last visit -At this visit, she gained 2 pounds  Component     Latest Ref Rng & Units 06/07/2018  Glucose     65 - 99 mg/dL 117 (H)  BUN     7 - 25 mg/dL 15  Creatinine     0.50 - 1.05 mg/dL 0.74  GFR, Est Non African American     > OR = 60 mL/min/1.66m2 91  GFR, Est African American     > OR = 60 mL/min/1.21m2 105  BUN/Creatinine Ratio     6 - 22 (calc) NOT APPLICABLE  Sodium     657 - 146 mmol/L 141  Potassium     3.5 - 5.3 mmol/L 4.2  Chloride     98 - 110 mmol/L 106  CO2     20 - 32 mmol/L 26  Calcium     8.6 - 10.4 mg/dL 9.2  Total Protein     6.1 - 8.1 g/dL 6.7  Albumin MSPROF     3.6 - 5.1 g/dL 4.0  Globulin     1.9 - 3.7 g/dL (calc) 2.7  AG Ratio     1.0 - 2.5 (calc) 1.5  Total Bilirubin     0.2 - 1.2 mg/dL 0.4  Alkaline phosphatase (APISO)     33 - 130 U/L 57  AST     10 - 35 U/L 15  ALT     6 - 29 U/L 11  Cholesterol     0 - 200 mg/dL 233 (H)  Triglycerides     0.0 - 149.0 mg/dL 378.0 (H)  HDL Cholesterol     >39.00 mg/dL 38.50 (L)  VLDL     0.0 - 40.0 mg/dL 75.6 (H)  Total CHOL/HDL Ratio      6  NonHDL      194.34  Microalb, Ur     0.0 - 1.9 mg/dL 1.7  Creatinine,U     mg/dL 218.2  MICROALB/CREAT RATIO     0.0 - 30.0 mg/g 0.8  Direct LDL     mg/dL 166.0   Her entire lipid panel is very abnormal.  Triglycerides are very high, as is her LDL cholesterol.  HDL is low. We will place a  referral to the lipid clinic as we discussed.  Philemon Kingdom, MD PhD [facility] Endocrinology

## 2018-06-07 NOTE — Patient Instructions (Addendum)
Please continue metformin 500 mg daily.  Please stop at the lab.  Please return in 6 months with your sugar log.

## 2018-06-08 ENCOUNTER — Encounter: Payer: Self-pay | Admitting: Internal Medicine

## 2018-06-17 ENCOUNTER — Ambulatory Visit (INDEPENDENT_AMBULATORY_CARE_PROVIDER_SITE_OTHER): Admitting: Internal Medicine

## 2018-06-17 ENCOUNTER — Encounter: Payer: Self-pay | Admitting: Internal Medicine

## 2018-06-17 VITALS — BP 158/90 | HR 95 | Ht 63.0 in | Wt 168.4 lb

## 2018-06-17 DIAGNOSIS — E119 Type 2 diabetes mellitus without complications: Secondary | ICD-10-CM | POA: Diagnosis not present

## 2018-06-17 DIAGNOSIS — E785 Hyperlipidemia, unspecified: Secondary | ICD-10-CM | POA: Diagnosis not present

## 2018-06-17 DIAGNOSIS — Z8249 Family history of ischemic heart disease and other diseases of the circulatory system: Secondary | ICD-10-CM

## 2018-06-17 DIAGNOSIS — Z789 Other specified health status: Secondary | ICD-10-CM | POA: Diagnosis not present

## 2018-06-17 NOTE — Patient Instructions (Addendum)
Medication Instructions:  Dr. Debara Pickett recommends Repatha (PCSK9). This is an injectable cholesterol medication. This medication will need prior approval with your insurance company, which we will work on. If the medication is not approved initially, we may need to do an appeal with your insurance. We will keep you updated on this process. This medication can be provided at some local pharmacies or be shipped to you from a specialty pharmacy.   You can access a co-pay card here: [email or web address]  If you need a refill on your cardiac medications before your next appointment, please call your pharmacy.   Lab work: FASTING lab work in 3-4 months to check cholesterol If you have labs (blood work) drawn today and your tests are completely normal, you will receive your results only by: Marland Kitchen MyChart Message (if you have MyChart) OR . A paper copy in the mail If you have any lab test that is abnormal or we need to change your treatment, we will call you to review the results.  Testing/Procedures: Dr. Debara Pickett has ordered a CT coronary calcium score. This test is done at [address].  Coronary CalciumScan A coronary calcium scan is an imaging test used to look for deposits of calcium and other fatty materials (plaques) in the inner lining of the blood vessels of the heart (coronary arteries). These deposits of calcium and plaques can partly clog and narrow the coronary arteries without producing any symptoms or warning signs. This puts a person at risk for a heart attack. This test can detect these deposits before symptoms develop. Tell a health care provider about:  Any allergies you have.  All medicines you are taking, including vitamins, herbs, eye drops, creams, and over-the-counter medicines.  Any problems you or family members have had with anesthetic medicines.  Any blood disorders you have.  Any surgeries you have had.  Any  medical conditions you have.  Whether you are pregnant or may be pregnant. What are the risks? Generally, this is a safe procedure. However, problems may occur, including:  Harm to a pregnant woman and her unborn baby. This test involves the use of radiation. Radiation exposure can be dangerous to a pregnant woman and her unborn baby. If you are pregnant, you generally should not have this procedure done.  Slight increase in the risk of cancer. This is because of the radiation involved in the test. What happens before the procedure? No preparation is needed for this procedure. What happens during the procedure?  You will undress and remove any jewelry around your neck or chest.  You will put on a hospital gown.  Sticky electrodes will be placed on your chest. The electrodes will be connected to an electrocardiogram (ECG) machine to record a tracing of the electrical activity of your heart.  A CT scanner will take pictures of your heart. During this time, you will be asked to lie still and hold your breath for 2-3 seconds while a picture of your heart is being taken. The procedure may vary among health care providers and hospitals. What happens after the procedure?  You can get dressed.  You can return to your normal activities.  It is up to you to get the results of your test. Ask your health care provider, or the department that is doing the test, when your results will be ready. Summary  A coronary calcium scan is an imaging test used to look for deposits of calcium and other fatty materials (plaques) in the  inner lining of the blood vessels of the heart (coronary arteries).  Generally, this is a safe procedure. Tell your health care provider if you are pregnant or may be pregnant.  No preparation is needed for this procedure.  A CT scanner will take pictures of your heart.  You can return to your normal activities after the scan is done. This information is not intended to  replace advice given to you by your health care provider. Make sure you discuss any questions you have with your health care provider. Document Released: 02/07/2008 Document Revised: 06/30/2016 Document Reviewed: 06/30/2016 Elsevier Interactive Patient Education  2017 [place]: At [facility], you and your health needs are our priority.  As part of our continuing mission to provide you with exceptional heart care, we have created designated Provider Care Teams.  These Care Teams include your primary Cardiologist (physician) and Advanced Practice Providers (APPs -  Physician Assistants and Nurse Practitioners) who all work together to provide you with the care you need, when you need it. . You will need a follow up appointment in 3-4 months with Dr. Debara Pickett for lipid clinic  . Please have fasting lab work done prior to this visit  Any Other Special Instructions Will Be Listed Below (If Applicable).

## 2018-06-18 ENCOUNTER — Encounter: Payer: Self-pay | Admitting: Internal Medicine

## 2018-06-18 DIAGNOSIS — Z789 Other specified health status: Secondary | ICD-10-CM | POA: Insufficient documentation

## 2018-06-18 NOTE — Progress Notes (Addendum)
Lipid Clinic Consult Note  Chief Complaint:  Manage dyslipidemia  Primary Care Physician: Vernie Shanks, MD  HPI:  Shannon Mora is a 57 y.o. female who is being seen today for the evaluation of dyslipidemia at the request of Philemon Kingdom, MD.  This is a pleasant 58 year old female with type 2 diabetes followed by Dr. Cruzita Lederer in endocrinology.  She has been noted to have difficult to control dyslipidemia with primarily elevated triglycerides as well as high LDL.  Her last direct LDL 10 days ago was 166.  Total cholesterol was 233, triglycerides 378 and HDL of 38.5.  Non-HDL cholesterol was 194 (approximately 30 points higher as expected then LDL cholesterol, suggesting concordance).  Unfortunately, Shannon Mora has had difficulty with medications.  She has had side effects with multiple statin medications including atorvastatin, rosuvastatin, simvastatin, ezetimibe, and alternatives such as the fibrates.  Her A1c is well controlled at 6.4.  She is also not an alcohol user.  There is family history of early onset heart disease in her father.  Really she is asymptomatic, denying any chest pain, worsening shortness of breath or associated symptoms.  Her statin side effects and not only included myalgias but significant back pain and joint stiffness.  She also had symptoms with fenofibrate and cholestyramine.  She did get some improvement in her LDL cholesterol on rosuvastatin, but again it was not tolerated.  PMHx:  Past Medical History:  Diagnosis Date  . Basal cell cancer    On forehead.  Marland Kitchen GERD (gastroesophageal reflux disease)   . HSV-1 (herpes simplex virus 1) infection   . Hyperglycemia   . Hyperlipidemia   . Perimenopausal   . Vitamin D deficiency      FAMHx:  Family History  Problem Relation Age of Onset  . Heart disease Father     SOCHx:   reports that she has never smoked. She has never used smokeless tobacco. She reports that she drinks about 1.0 standard drinks of  alcohol per week. She reports that she does not use drugs.  ALLERGIES:  No Known Allergies  ROS: Pertinent items noted in HPI and remainder of comprehensive ROS otherwise negative.  HOME MEDS: Current Outpatient Medications on File Prior to Visit  Medication Sig Dispense Refill  . Calcium Carbonate-Vit D-Min (CALCIUM 1200 PO) Take 1,000 mg by mouth.    . Cholecalciferol (VITAMIN D3) 2000 units TABS Take by mouth.    . Coenzyme Q10 (CO Q10) 200 MG CAPS Co Q-10 200 mg capsule  Take by oral route.    . Evolocumab (REPATHA SURECLICK) 924 MG/ML SOAJ Inject 1 Dose into the skin every 14 (fourteen) days.    Marland Kitchen glucose blood (FREESTYLE LITE) test strip Use to check blood sugars one time daily 100 each 12  . Lancets (FREESTYLE) lancets Use to check blood sugars one time daily 100 each 12  . meloxicam (MOBIC) 15 MG tablet Take 1 tablet by mouth daily.  2  . metFORMIN (GLUCOPHAGE) 500 MG tablet TAKE 1 TABLET BY MOUTH EVERY DAY 30 tablet 0  . Multiple Vitamins-Minerals (EYE VITAMINS PO) Take 1 capsule by mouth daily.      Marland Kitchen tiZANidine (ZANAFLEX) 2 MG tablet Take 1 tablet by mouth as directed.  0   No current facility-administered medications on file prior to visit.     LABS/IMAGING: No results found for this or any previous visit (from the past 48 hour(s)). No results found.  LIPID PANEL:    Component Value Date/Time  CHOL 233 (H) 06/07/2018 0819   TRIG 378.0 (H) 06/07/2018 0819   TRIG 246 (H) 12/02/2013 1239   HDL 38.50 (L) 06/07/2018 0819   HDL 45 12/02/2013 1239   CHOLHDL 6 06/07/2018 0819   VLDL 75.6 (H) 06/07/2018 0819   LDLCALC 138 (H) 12/02/2013 1239   LDLDIRECT 166.0 06/07/2018 0819    WEIGHTS: Wt Readings from Last 3 Encounters:  06/17/18 168 lb 6.4 oz (76.4 kg)  06/07/18 166 lb (75.3 kg)  01/28/18 164 lb 6.4 oz (74.6 kg)    VITALS: BP (!) 158/90   Pulse 95   Ht 5\' 3"  (1.6 m)   Wt 168 lb 6.4 oz (76.4 kg)   LMP 10/07/2013   SpO2 96%   BMI 29.83 kg/m    EXAM: General appearance: alert and no distress Neck: no carotid bruit, no JVD and thyroid not enlarged, symmetric, no tenderness/mass/nodules Lungs: clear to auscultation bilaterally Heart: regular rate and rhythm Abdomen: soft, non-tender; bowel sounds normal; no masses,  no organomegaly Extremities: extremities normal, atraumatic, no cyanosis or edema Pulses: 2+ and symmetric Skin: Skin color, texture, turgor normal. No rashes or lesions Neurologic: Grossly normal Psych: Pleasant  EKG: Deferred  ASSESSMENT: 1. Mixed dyslipidemia (high LDL, triglycerides and low HDL) 2. Type 2 diabetes 3. Family history of premature coronary disease 4. Statin intolerance  PLAN: 1.   Shannon Mora is a 57 year old diabetic female with a family history of premature coronary disease which is strong and has a marked mixed dyslipidemia.  Unfortunately she is been intolerant to 3 statins including high intensity statin, fenofibrate and cholestyramine as well as ezetimibe.  All seem to cause for the same symptoms.  Her lipid profile is poorly controlled with a direct LDL 166.  I think she is a good candidate for PCSK9 inhibitor as she is at increased coronary risk.  In addition it would be helpful to know if she has any underlying coronary artery disease prematurely and I recommend coronary artery calcium scoring.  We will plan to pursue Repatha.  I discussed the use of that and we demonstrated the injector with her today.  Follow-up with me in 3 to 4 months with a repeat lipid profile.  Thanks again for your kind referral.  Pixie Casino, MD, FACC, [place] Director of the Advanced Lipid Disorders &  Cardiovascular Risk Reduction Clinic Diplomate of the American Board of Clinical Lipidology Attending Cardiologist  Direct Dial: [phone]  Fax: [phone]  Website:  [email or web address] [email or web address] Alette Kataoka 06/18/2018, 1:45 PM

## 2018-06-26 ENCOUNTER — Encounter: Payer: Self-pay | Admitting: Internal Medicine

## 2018-06-28 ENCOUNTER — Other Ambulatory Visit: Payer: Self-pay

## 2018-06-28 MED ORDER — METFORMIN HCL 500 MG PO TABS: 500.0000 mg | ORAL_TABLET | Freq: Every day | ORAL | 2 refills | Status: DC

## 2018-07-01 ENCOUNTER — Ambulatory Visit (INDEPENDENT_AMBULATORY_CARE_PROVIDER_SITE_OTHER)
Admission: RE | Admit: 2018-07-01 | Discharge: 2018-07-01 | Disposition: A | Discharge status: Home / Self Care | Payer: Self-pay | Source: Ambulatory Visit | Attending: Internal Medicine | Admitting: Internal Medicine

## 2018-07-01 DIAGNOSIS — Z8249 Family history of ischemic heart disease and other diseases of the circulatory system: Secondary | ICD-10-CM

## 2018-07-01 DIAGNOSIS — E785 Hyperlipidemia, unspecified: Secondary | ICD-10-CM

## 2018-07-01 DIAGNOSIS — E119 Type 2 diabetes mellitus without complications: Secondary | ICD-10-CM

## 2018-07-06 ENCOUNTER — Telehealth: Payer: Self-pay | Admitting: Internal Medicine

## 2018-07-06 NOTE — Telephone Encounter (Signed)
PA for repatha sureclick submitted via covermymeds.com  (Key: [id number])

## 2018-07-09 NOTE — Telephone Encounter (Signed)
Per covermymeds.com, PA for Repatha [employer] has been denied.   Called [employer] prior authorization line thru [employer] @ [phone] option 1 for info on why request was denied.   Per rep, patient was denied coverage as she does not have documented HeFH, HoFH, or clinical ASCVD. Rep will fax denial information.   An appeal can be submitted  Appeal Information:  ATTN: [place] [address]  Phone:[phone]  Fax:[phone]  Will wait on fax from [employer]

## 2018-07-12 NOTE — Telephone Encounter (Signed)
I would appeal this decision based on these grounds: She is a diabetic and at least intermediate risk. Her goal LDL is <70. Regardless of her lack of ASCVD, she meets guideline recommendations for additional therapy.  Dr. Lemmie Evens

## 2018-07-12 NOTE — Telephone Encounter (Signed)
Per letter received from [place], patient will need to file a request for an appeal in writing with her signature, with a copy of the denial decision, any supporting documentation she may have, and indicate if she wishes to appointment her MD or someone else to present her on an appeal.   Patient notified via [place]

## 2018-07-12 NOTE — Telephone Encounter (Signed)
To date, no faxed denial info has been received.   Routed to MD for next steps.

## 2018-08-04 NOTE — Telephone Encounter (Signed)
Patient read MyChart message @ 7:06 PM on 07/12/2018  Waiting on reply from her for next steps

## 2018-08-26 ENCOUNTER — Encounter: Payer: Self-pay | Admitting: Internal Medicine

## 2018-09-06 ENCOUNTER — Telehealth: Payer: Self-pay | Admitting: Internal Medicine

## 2018-09-06 NOTE — Telephone Encounter (Signed)
Faxed appeal for Repatha to [employer] @ [phone]. The appeal info includes: paperwork received from [employer] that was completed/signed, appeals letter, MD office note, lab results, patient's letter requesting an appeal.

## 2018-09-09 NOTE — Telephone Encounter (Signed)
Received faxed notice from [employer] that [employer] has NOT approved coverage of Repatha or Praluent, despite the FDA issuance of approval for Repatha to be used to prevent CV events in patients with hyperlipidemia ([place] benefit does not provide coverage for this indication)  Routed to MD for recommendations

## 2018-09-16 ENCOUNTER — Ambulatory Visit: Admitting: Internal Medicine

## 2018-09-26 ENCOUNTER — Other Ambulatory Visit: Payer: Self-pay | Admitting: Internal Medicine

## 2018-10-27 ENCOUNTER — Encounter: Payer: Self-pay | Admitting: Internal Medicine

## 2018-11-17 ENCOUNTER — Telehealth: Payer: Self-pay | Admitting: Internal Medicine

## 2018-11-17 NOTE — Telephone Encounter (Signed)
   Primary Cardiologist:  No primary care provider on file.   Patient contacted.  History reviewed.  No symptoms to suggest any unstable cardiac conditions.  Based on discussion, with current pandemic situation, we will be postponing this appointment for Shannon Mora with a plan for f/u in 9 wks or sooner if feasible/necessary.  If symptoms change, she has been instructed to contact our office.   Routing to C19 CANCEL pool for tracking (P CV DIV CV19 CANCEL - reason for visit "other.") and assigning priority (1 = 4-6 wks, 2 = 6-12 wks, 3 = >12 wks).   Pixie Casino, MD  11/17/2018 10:46 AM         .

## 2018-11-24 ENCOUNTER — Ambulatory Visit: Admitting: Internal Medicine

## 2018-11-30 ENCOUNTER — Telehealth: Payer: Self-pay | Admitting: Internal Medicine

## 2018-11-30 NOTE — Telephone Encounter (Signed)
Called patient to reschedule Dr. Lysbeth Penner appointment and LVM.

## 2018-12-06 ENCOUNTER — Ambulatory Visit: Admitting: Internal Medicine

## 2019-01-03 DIAGNOSIS — E785 Hyperlipidemia, unspecified: Secondary | ICD-10-CM

## 2019-01-07 LAB — LIPID PANEL
Chol/HDL Ratio: 6.4 ratio — ABNORMAL HIGH (ref 0.0–4.4)
Cholesterol, Total: 256 mg/dL — ABNORMAL HIGH (ref 100–199)
HDL: 40 mg/dL (ref >39)
LDL Calculated: 139 mg/dL — ABNORMAL HIGH (ref 0–99)
Triglycerides: 384 mg/dL — ABNORMAL HIGH (ref 0–149)
VLDL Cholesterol Cal: 77 mg/dL — ABNORMAL HIGH (ref 5–40)

## 2019-01-07 LAB — LDL CHOLESTEROL, DIRECT: LDL Direct: 173 mg/dL — ABNORMAL HIGH (ref 0–99)

## 2019-01-13 ENCOUNTER — Telehealth: Payer: Self-pay | Admitting: Internal Medicine

## 2019-01-13 MED ORDER — BEMPEDOIC ACID 180 MG PO TABS: 1.0000 | ORAL_TABLET | Freq: Every day | ORAL | 3 refills | Status: DC

## 2019-01-13 NOTE — Addendum Note (Signed)
Addended by: Fidel Levy on: 01/13/2019 02:04 PM   Modules accepted: Orders

## 2019-01-13 NOTE — Telephone Encounter (Signed)
Nexletol 180mg  QD Rx sent to [facility] per patient request

## 2019-01-13 NOTE — Telephone Encounter (Signed)
[place] prior authorization @ [phone] to complete Nexletol 180mg  QD prior authorization  $60 for 30 days at retail and patient can obtain from mail order for same cost but 90 day supply option  Case# [id number] Approval 12/14/2018 - lifetime   Patient notified via MyChart message

## 2019-02-01 ENCOUNTER — Encounter (INDEPENDENT_AMBULATORY_CARE_PROVIDER_SITE_OTHER): Admitting: Ophthalmology

## 2019-02-07 ENCOUNTER — Other Ambulatory Visit: Payer: Self-pay

## 2019-02-07 ENCOUNTER — Encounter (INDEPENDENT_AMBULATORY_CARE_PROVIDER_SITE_OTHER): Admitting: Ophthalmology

## 2019-02-07 DIAGNOSIS — H353132 Nonexudative age-related macular degeneration, bilateral, intermediate dry stage: Secondary | ICD-10-CM

## 2019-02-07 DIAGNOSIS — H2513 Age-related nuclear cataract, bilateral: Secondary | ICD-10-CM

## 2019-02-07 DIAGNOSIS — H43813 Vitreous degeneration, bilateral: Secondary | ICD-10-CM | POA: Diagnosis not present

## 2019-02-07 DIAGNOSIS — H35033 Hypertensive retinopathy, bilateral: Secondary | ICD-10-CM | POA: Diagnosis not present

## 2019-02-07 DIAGNOSIS — I10 Essential (primary) hypertension: Secondary | ICD-10-CM | POA: Diagnosis not present

## 2019-02-16 NOTE — Telephone Encounter (Signed)

## 2019-02-17 ENCOUNTER — Telehealth: Payer: Self-pay | Admitting: Internal Medicine

## 2019-02-17 NOTE — Telephone Encounter (Signed)
New message   Patient states that she is having pain between two shoulder blades across back. Please call to discuss.

## 2019-02-17 NOTE — Telephone Encounter (Signed)
Called and notified patient. Patient verbalized understanding. 

## 2019-02-17 NOTE — Telephone Encounter (Signed)
Sounds like musculoskeletal pain, since it is at rest and responds to tylenol. She could consider a few days of ibuprofen 600 mg BID. BP is up, but may be related to pain - would monitor. Doubt it is the Nexletol - this has very few side effects, but upper back pain is not one.  Dr. Lemmie Evens

## 2019-02-17 NOTE — Telephone Encounter (Signed)
Called and spoke to patient, she states that last night and this morning she has been having pain across her shoulder blade (one to another), she states it started last night, she finally fell asleep; but woke up at 5 AM with it hurting her again. She states she checked her BP at 5 AM it was 181/82 HR 82, checked it a few hours later it was 172/90 HR 85, and she just checked it again a few moments ago it was 150/78 HR 69. She states it is worse when she does lay on her back so sitting up feels better. She did take Tylenol at 5:45 AM this morning and it seems to be somewhat better but she still notices the pain in the should blades.   Patient denies frontal chest pain, arm pain, SOB, medication changes/diet changes, swelling, and no abdominal pain.   Patient states she did start the [facility] in May, but she states she has been on that for a while and this only started yesterday. I advised patient it could be muscular but I would route to MD for advice.  Patient has appointment with MD 06/29 at 11:15, patient just did not want to wait if she should be doing something else.

## 2019-02-21 ENCOUNTER — Encounter: Payer: Self-pay | Admitting: Internal Medicine

## 2019-02-21 ENCOUNTER — Ambulatory Visit (INDEPENDENT_AMBULATORY_CARE_PROVIDER_SITE_OTHER): Admitting: Internal Medicine

## 2019-02-21 ENCOUNTER — Other Ambulatory Visit: Payer: Self-pay

## 2019-02-21 VITALS — BP 155/82 | HR 82 | Temp 98.4°F | Ht 62.0 in | Wt 166.0 lb

## 2019-02-21 DIAGNOSIS — Z789 Other specified health status: Secondary | ICD-10-CM | POA: Diagnosis not present

## 2019-02-21 DIAGNOSIS — E119 Type 2 diabetes mellitus without complications: Secondary | ICD-10-CM

## 2019-02-21 DIAGNOSIS — I10 Essential (primary) hypertension: Secondary | ICD-10-CM | POA: Diagnosis not present

## 2019-02-21 DIAGNOSIS — E785 Hyperlipidemia, unspecified: Secondary | ICD-10-CM

## 2019-02-21 MED ORDER — TELMISARTAN 40 MG PO TABS: 40.0000 mg | ORAL_TABLET | Freq: Every day | ORAL | 3 refills | Status: DC

## 2019-02-21 NOTE — Progress Notes (Signed)
Lipid Clinic Consult Note  Chief Complaint:  Manage dyslipidemia  Primary Care Physician: Vernie Shanks, MD  HPI:  Shannon Mora is a 58 y.o. female who is being seen today for the evaluation of dyslipidemia at the request of Vernie Shanks, MD.  This is a pleasant 58 year old female with type 2 diabetes followed by Dr. Cruzita Lederer in endocrinology.  She has been noted to have difficult to control dyslipidemia with primarily elevated triglycerides as well as high LDL.  Her last direct LDL 10 days ago was 166.  Total cholesterol was 233, triglycerides 378 and HDL of 38.5.  Non-HDL cholesterol was 194 (approximately 30 points higher as expected then LDL cholesterol, suggesting concordance).  Unfortunately, Shannon Mora has had difficulty with medications.  She has had side effects with multiple statin medications including atorvastatin, rosuvastatin, simvastatin, ezetimibe, and alternatives such as the fibrates.  Her A1c is well controlled at 6.4.  She is also not an alcohol user.  There is family history of early onset heart disease in her father.  Really she is asymptomatic, denying any chest pain, worsening shortness of breath or associated symptoms.  Her statin side effects and not only included myalgias but significant back pain and joint stiffness.  She also had symptoms with fenofibrate and cholestyramine.  She did get some improvement in her LDL cholesterol on rosuvastatin, but again it was not tolerated.  02/21/2019  Shannon Mora is seen here for follow-up.  She had a marked dyslipidemia as above and underwent CT coronary calcium scoring.  Fortunately showed 0 coronary calcium score.  This is associated with low risk of events.  We had recommended pursuing PCSK9 inhibitor therapy given her intolerance to statins however this was declined due to lack of ASCVD.  Based on this, elected to pursue therapy with a newly available Nexletol.  She has been taking this now for a month and seems to be  tolerating it well.  Although it is only moderate potency, this should be associated with some improvement in her symptoms.  Optionally, we may be able to add ezetimibe to this therapy.  She apparently had had side effects to this however it may have been in combination with simvastatin and the statin is certainly more likely to cause her side effects.  Also today, she noted that blood pressures been elevated.  Although we have been seeing her for dyslipidemia, her PCP requested that we evaluated her for hypertension.  I reviewed blood pressures over the last couple of years which showed generally blood pressures between 889-169 systolic.  As a diabetic, she would benefit for them renal protection with ACE or ARB.  PMHx:  Past Medical History:  Diagnosis Date  . Basal cell cancer    On forehead.  Marland Kitchen GERD (gastroesophageal reflux disease)   . HSV-1 (herpes simplex virus 1) infection   . Hyperglycemia   . Hyperlipidemia   . Perimenopausal   . Vitamin D deficiency      FAMHx:  Family History  Problem Relation Age of Onset  . Heart disease Father     SOCHx:   reports that she has never smoked. She has never used smokeless tobacco. She reports current alcohol use of about 1.0 standard drinks of alcohol per week. She reports that she does not use drugs.  ALLERGIES:  Allergies  Allergen Reactions  . Statins Other (See Comments)    Atorvastatin, rosuvastatin, simvastatin, ezetimibe    ROS: Pertinent items noted in HPI and remainder of comprehensive  ROS otherwise negative.  HOME MEDS: Current Outpatient Medications on File Prior to Visit  Medication Sig Dispense Refill  . Bempedoic Acid (NEXLETOL) 180 MG TABS Take 1 Dose by mouth daily. 90 tablet 3  . Calcium Carbonate-Vit D-Min (CALCIUM 1200 PO) Take 1,000 mg by mouth.    . Cholecalciferol (VITAMIN D3) 2000 units TABS Take by mouth.    . Coenzyme Q10 (CO Q10) 200 MG CAPS Co Q-10 200 mg capsule  Take by oral route.    .  Cyanocobalamin (B-12) 1000 MCG TABS Take 1 tablet by mouth once a week.    Marland Kitchen glucose blood (FREESTYLE LITE) test strip Use to check blood sugars one time daily 100 each 12  . Lancets (FREESTYLE) lancets Use to check blood sugars one time daily 100 each 12  . meloxicam (MOBIC) 15 MG tablet Take 1 tablet by mouth daily.  2  . metFORMIN (GLUCOPHAGE) 500 MG tablet TAKE 1 TABLET DAILY 90 tablet 4  . Multiple Vitamins-Minerals (EYE VITAMINS PO) Take 1 capsule by mouth daily.       No current facility-administered medications on file prior to visit.     LABS/IMAGING: No results found for this or any previous visit (from the past 48 hour(s)). No results found.  LIPID PANEL:    Component Value Date/Time   CHOL 256 (H) 01/07/2019 1107   TRIG 384 (H) 01/07/2019 1107   TRIG 246 (H) 12/02/2013 1239   HDL 40 01/07/2019 1107   HDL 45 12/02/2013 1239   CHOLHDL 6.4 (H) 01/07/2019 1107   CHOLHDL 6 06/07/2018 0819   VLDL 75.6 (H) 06/07/2018 0819   LDLCALC 139 (H) 01/07/2019 1107   LDLCALC 138 (H) 12/02/2013 1239   LDLDIRECT 173 (H) 01/07/2019 1107   LDLDIRECT 166.0 06/07/2018 0819    WEIGHTS: Wt Readings from Last 3 Encounters:  02/21/19 166 lb (75.3 kg)  06/17/18 168 lb 6.4 oz (76.4 kg)  06/07/18 166 lb (75.3 kg)    VITALS: BP (!) 155/82   Pulse 82   Temp 98.4 F (36.9 C)   Ht 5\' 2"  (1.575 m)   Wt 166 lb (75.3 kg)   LMP 10/07/2013   SpO2 98%   BMI 30.36 kg/m   EXAM: General appearance: alert and no distress Neck: no carotid bruit, no JVD and thyroid not enlarged, symmetric, no tenderness/mass/nodules Lungs: clear to auscultation bilaterally Heart: regular rate and rhythm Abdomen: soft, non-tender; bowel sounds normal; no masses,  no organomegaly Extremities: extremities normal, atraumatic, no cyanosis or edema Pulses: 2+ and symmetric Skin: Skin color, texture, turgor normal. No rashes or lesions Neurologic: Grossly normal Psych: Pleasant  EKG: Deferred  ASSESSMENT: 1.  Mixed dyslipidemia (high LDL, triglycerides and low HDL) 2. Type 2 diabetes 3. Family history of premature coronary disease - Zero (0) CAC 4. Statin intolerance 5. Essential hypertension  PLAN: 1.   Shannon Mora had a 0 coronary artery calcium score.  She seems to be tolerating Nexletol and we will reassess her lipid profile in about 3 months.  I suspect she was not intolerant of ezetimibe rather may been a combination with simvastatin and had side effects with that.  We may consider adding ezetimibe which is also now available as Nexlizet.  Additionally, her blood pressure has been consistently elevated.  Given her diabetes she would benefit from an ACE inhibitor or ARB for renal protection.  Start telmisartan 40 mg daily today.  We will reassess her blood pressure readings on follow-up in 3 months.  Pixie Casino, MD, [facility], [place] Director of the Advanced Lipid Disorders &  Cardiovascular Risk Reduction Clinic Diplomate of the American Board of Clinical Lipidology Attending Cardiologist  Direct Dial: [phone]  Fax: [phone]  Website:  [email or web address] Monick Rena 02/21/2019, 11:48 AM

## 2019-02-21 NOTE — Patient Instructions (Signed)
Medication Instructions:  START telmisartan 40mg  daily   If you need a refill on your cardiac medications before your next appointment, please call your pharmacy.   Lab work: FASTING lab work in 3 months If you have labs (blood work) drawn today and your tests are completely normal, you will receive your results only by: Marland Kitchen MyChart Message (if you have MyChart) OR . A paper copy in the mail If you have any lab test that is abnormal or we need to change your treatment, we will call you to review the results.  Follow-Up: At [facility], you and your health needs are our priority.  As part of our continuing mission to provide you with exceptional heart care, we have created designated Provider Care Teams.  These Care Teams include your primary Cardiologist (physician) and Advanced Practice Providers (APPs -  Physician Assistants and Nurse Practitioners) who all work together to provide you with the care you need, when you need it. You will need a follow up appointment in 3 months.You may see Dr. Debara Pickett or one of the following Advanced Practice Providers on your designated Care Team: Almyra Deforest, [place] . Fabian Sharp, PA-C

## 2019-02-23 ENCOUNTER — Ambulatory Visit: Admitting: Internal Medicine

## 2019-03-08 ENCOUNTER — Ambulatory Visit: Admitting: Internal Medicine

## 2019-03-28 ENCOUNTER — Other Ambulatory Visit: Payer: Self-pay | Admitting: Internal Medicine

## 2019-03-28 DIAGNOSIS — E785 Hyperlipidemia, unspecified: Secondary | ICD-10-CM

## 2019-04-11 ENCOUNTER — Other Ambulatory Visit: Payer: Self-pay

## 2019-04-13 ENCOUNTER — Encounter: Payer: Self-pay | Admitting: Internal Medicine

## 2019-04-13 ENCOUNTER — Ambulatory Visit (INDEPENDENT_AMBULATORY_CARE_PROVIDER_SITE_OTHER): Admitting: Internal Medicine

## 2019-04-13 ENCOUNTER — Other Ambulatory Visit: Payer: Self-pay

## 2019-04-13 VITALS — BP 142/70 | HR 76 | Ht 62.21 in | Wt 159.0 lb

## 2019-04-13 DIAGNOSIS — E663 Overweight: Secondary | ICD-10-CM

## 2019-04-13 DIAGNOSIS — E119 Type 2 diabetes mellitus without complications: Secondary | ICD-10-CM

## 2019-04-13 DIAGNOSIS — E785 Hyperlipidemia, unspecified: Secondary | ICD-10-CM | POA: Diagnosis not present

## 2019-04-13 LAB — POCT GLYCOSYLATED HEMOGLOBIN (HGB A1C): Hemoglobin A1C: 6.2 % — AB (ref 4.0–5.6)

## 2019-04-13 NOTE — Patient Instructions (Signed)
Please continue metformin 500 mg daily.  Please return in 6 months with your sugar log.

## 2019-04-13 NOTE — Progress Notes (Signed)
Patient ID: Shannon Mora, female   DOB: 1960-09-29, 58 y.o.   MRN: [id number]  HPI: Shannon Mora is a 58 y.o.-year-old female, returning for follow-up for DM2, dx in 2015, prev. GDM 1989, 1992, non-insulin-dependent, controlled, without long-term complications and also hyperlipidemia.  Last visit 10 months ago.  She continues on a mostly plant-based diet, but added some lean meat.  She has been more strict with her diet in the last 2 months that she lost 7 pounds.    DM2: Last hemoglobin A1c was:  Lab Results  Component Value Date   HGBA1C 6.4 (A) 06/07/2018   HGBA1C 6.6 (A) 01/28/2018   HGBA1C 6.7 10/23/2017  07/07/2017: HbA1c 6.8% 03/03/2017: HbA1c 6.5% 10/30/2016: HbA1c 6.6%  Patient is on: - Metformin 500 mg daily in a.m.  She is checking her sugars 1x a day: - am: n/c >> 89-127 >> 78-81 >> 84-113 - 2h after b'fast: n/c >> 130 >> 101 - before lunch: n/c >> 83-109 >> n/c >> 89-93 - 2h after lunch: n/c >> 124 >> 114 >> n/c - before dinner: n/c >> 95 >> n/c >> 92-101, 113 - 2h after dinner: n/c >> 180 >> 122 - bedtime: n/c >> 146 - nighttime: n/c Lowest sugar was 78 >> 84; it is unclear at which level she has hypoglycemia awareness Highest sugar was 180 >> 146.  Glucometer: AccuCheck  Pt's meals are: - Breakfast: egg white, vegan links, coffee - Lunch: protein shake >> now salad + [place] breast (1/4 cup) - Dinner: vegetarian dish or fish once a week, occasional poultry >> meat + veggies - no carbs - Snacks: 1-2  -No CKD, last BUN/creatinine:  Lab Results  Component Value Date   BUN 15 06/07/2018   BUN 7 12/02/2013   CREATININE 0.74 06/07/2018   CREATININE 0.63 12/02/2013  07/07/2017: Glucose 108, BUN/creatinine 14/0.75 - last eye exam was on 02/07/2019: No DR, + macular degeneration (pseudovitelliform dystrophy). Dr. Lady Gary and Dr. Zigmund Daniel. -No numbness and tingling in her feet.  Pt has FH of DM in brother.  Hyperlipidemia: -+ Significant  hyperlipidemia: Lab Results  Component Value Date   CHOL 256 (H) 01/07/2019   HDL 40 01/07/2019   LDLCALC 139 (H) 01/07/2019   LDLDIRECT 173 (H) 01/07/2019   TRIG 384 (H) 01/07/2019   CHOLHDL 6.4 (H) 01/07/2019  07/07/2017: 264/167/43/187 03/03/2017: 239/158/43/165  10/30/2016: 128/149/47/51 06/30/2016: 237/308/41/134 We tried fluvastatin XL (after preauthorization).  She could not tolerate this due to muscle aches and joint aches even when she was taking it every 3 days. She was on ezetimibe >> mm pain She is intolerant to statins: tried Lipitor, low-dose Crestor >> mm pain.  However, Crestor worked great for her >> LDL decreased to 51. She retried this 2020 >> joint pain She tried Fenofibrate and also Cholestyramine >> did not work. She is now seen in the lipid clinic and she was started on Bempedoic acid is a PCSK9 inhibitor was not covered by [employer].  No history of hypothyroidism.  Vitamin D level was normal: Lab Results  Component Value Date   TSH 1.770 2013-08-02   Lab Results  Component Value Date   VD25OH 32.8 12/02/2013   VD25OH 39.0 2013/08/02   Father died in his 81s from heart ds.  She has multiple relatives on father's side with hyperlipidemia and heart disease.  On Celexa.   Started Telmisartan.  ROS: Constitutional: no weight gain/+ weight loss, no fatigue, no subjective hyperthermia, no subjective hypothermia Eyes: no blurry vision, no  xerophthalmia ENT: no sore throat, no nodules palpated in neck, no dysphagia, no odynophagia, no hoarseness Cardiovascular: no CP/no SOB/no palpitations/no leg swelling Respiratory: no cough/no SOB/no wheezing Gastrointestinal: no N/no V/no D/no C/no acid reflux Musculoskeletal: no muscle aches/no joint aches Skin: no rashes, no hair loss Neurological: no tremors/no numbness/no tingling/no dizziness  I reviewed pt's medications, allergies, PMH, social hx, family hx, and changes were documented in the history of present  illness. Otherwise, unchanged from my initial visit note.  Surgeries: - tubal ligation sx 1992 - gall bladder sx - low lumbar sx  Past Medical History:  Diagnosis Date  . Basal cell cancer    On forehead.  Marland Kitchen GERD (gastroesophageal reflux disease)   . HSV-1 (herpes simplex virus 1) infection   . Hyperglycemia   . Hyperlipidemia   . Perimenopausal   . Vitamin D deficiency    Social History   Socioeconomic History  . Marital status: Married    Spouse name: Not on file  . Number of children: 2  Social Needs  Occupational History  . Not on file  Tobacco Use  . Smoking status: Never Smoker  . Smokeless tobacco: Never Used  Substance and Sexual Activity  . Alcohol use: Yes    Alcohol/week: 0.6 oz    Types: 1 Glasses of hard cider per mo    Comment: monthly  . Drug use: No   Current Outpatient Medications on File Prior to Visit  Medication Sig Dispense Refill  . Bempedoic Acid (NEXLETOL) 180 MG TABS Take 1 Dose by mouth daily. 90 tablet 3  . Calcium Carbonate-Vit D-Min (CALCIUM 1200 PO) Take 1,000 mg by mouth.    . Cholecalciferol (VITAMIN D3) 2000 units TABS Take by mouth.    . Coenzyme Q10 (CO Q10) 200 MG CAPS Co Q-10 200 mg capsule  Take by oral route.    . Cyanocobalamin (B-12) 1000 MCG TABS Take 1 tablet by mouth once a week.    Marland Kitchen glucose blood (FREESTYLE LITE) test strip Use to check blood sugars one time daily 100 each 12  . Lancets (FREESTYLE) lancets Use to check blood sugars one time daily 100 each 12  . meloxicam (MOBIC) 15 MG tablet Take 1 tablet by mouth daily.  2  . metFORMIN (GLUCOPHAGE) 500 MG tablet TAKE 1 TABLET DAILY 90 tablet 4  . Multiple Vitamins-Minerals (EYE VITAMINS PO) Take 1 capsule by mouth daily.      Marland Kitchen telmisartan (MICARDIS) 40 MG tablet Take 1 tablet (40 mg total) by mouth daily. 90 tablet 3   No current facility-administered medications on file prior to visit.    Allergies  Allergen Reactions  . Statins Other (See Comments)     Atorvastatin, rosuvastatin, simvastatin, ezetimibe   Family History  Problem Relation Age of Onset  . Heart disease Father     PE: BP (!) 142/70   Pulse 76   Ht 5' 2.21" (1.58 m) Comment: measured without shoes  Wt 159 lb (72.1 kg)   LMP 10/07/2013   SpO2 98%   BMI 28.89 kg/m  Wt Readings from Last 3 Encounters:  04/13/19 159 lb (72.1 kg)  02/21/19 166 lb (75.3 kg)  06/17/18 168 lb 6.4 oz (76.4 kg)   Constitutional: slightly overweight, in NAD Eyes: PERRLA, EOMI, no exophthalmos ENT: moist mucous membranes, no thyromegaly, no cervical lymphadenopathy Cardiovascular: RRR, No MRG Respiratory: CTA B Gastrointestinal: abdomen soft, NT, ND, BS+ Musculoskeletal: no deformities, strength intact in all 4 Skin: moist, warm, no rashes  Neurological: no tremor with outstretched hands, DTR normal in all 4  ASSESSMENT: 1. DM2, non-insulin-dependent, controlled, without long term complications  2. HL  3.  Overweight  PLAN:  1. Patient with several years of controlled diabetes, on oral antidiabetic regimen, with minimal metformin doses.  She is following a mostly plant-based diet, on which, her sugars improved.  At last visit, HbA1c was better, at 6.4%.  We did not change her regimen. -At this visit, she tells me that she introduced some animal protein, blood sugars remain well controlled and she actually lost 9 pounds since last visit.  She also started to walk more.  -Reviewed her sugars at home and these are almost all at goal.  There is no need to change her regimen.  We will continue the low-dose metformin, which she is tolerating well - I suggested to:  Patient Instructions  Please continue metformin 500 mg daily  Please return in 6 months with your sugar log.   - we checked her HbA1c: 6.2% (improved, excellent!) - advised to check sugars at different times of the day - 1x a day, rotating check times - advised for yearly eye exams >> she is UTD - return to clinic in 6  months   2. HL - pt with familial hyperlipidemia, with history of early heart disease in her father, in his early 37s.  She has previous intolerance to Lipitor and Crestor, even at low doses, despite improvement in her lipid panels.  She had increased muscle aches with Crestor.  She also try fenofibrate and bile acid sequestrants but these did not work for her.  We also tried fluvastatin XL which is metabolized for another cytochrome and has minimal muscle side effects, but she could not tolerate this even if she was taking it every 3 days.  - I referred her to the lipid clinic and she is now on Bempedoic acid as [employer] refused to coved PCSK9 inh.  She is tolerating this well.  She has an appointment with Dr. Debara Pickett in 3 weeks and will have another cholesterol panel checked at that time.  3.  Overweight -her weight started to improve on a more plant based diet -lost 9 lbs since last OV!  Philemon Kingdom, MD PhD [facility] Endocrinology

## 2019-04-21 ENCOUNTER — Encounter: Payer: Self-pay | Admitting: *Deleted

## 2019-04-26 LAB — LIPID PANEL
Chol/HDL Ratio: 5 ratio — ABNORMAL HIGH (ref 0.0–4.4)
Cholesterol, Total: 175 mg/dL (ref 100–199)
HDL: 35 mg/dL — ABNORMAL LOW (ref >39)
LDL Chol Calc (NIH): 90 mg/dL (ref 0–99)
Triglycerides: 300 mg/dL — ABNORMAL HIGH (ref 0–149)
VLDL Cholesterol Cal: 50 mg/dL — ABNORMAL HIGH (ref 5–40)

## 2019-04-26 LAB — LDL CHOLESTEROL, DIRECT: LDL Direct: 97 mg/dL (ref 0–99)

## 2019-04-29 ENCOUNTER — Encounter: Payer: Self-pay | Admitting: Internal Medicine

## 2019-05-03 ENCOUNTER — Encounter: Payer: Self-pay | Admitting: Internal Medicine

## 2019-05-03 ENCOUNTER — Telehealth (INDEPENDENT_AMBULATORY_CARE_PROVIDER_SITE_OTHER): Admitting: Internal Medicine

## 2019-05-03 VITALS — BP 151/81 | HR 71 | Ht 62.0 in | Wt 160.4 lb

## 2019-05-03 DIAGNOSIS — E785 Hyperlipidemia, unspecified: Secondary | ICD-10-CM | POA: Diagnosis not present

## 2019-05-03 DIAGNOSIS — I10 Essential (primary) hypertension: Secondary | ICD-10-CM | POA: Diagnosis not present

## 2019-05-03 DIAGNOSIS — Z789 Other specified health status: Secondary | ICD-10-CM | POA: Diagnosis not present

## 2019-05-03 NOTE — Progress Notes (Signed)
Virtual Visit via Video Note   This visit type was conducted due to national recommendations for restrictions regarding the COVID-19 Pandemic (e.g. social distancing) in an effort to limit this patient's exposure and mitigate transmission in our community.  Due to her co-morbid illnesses, this patient is at least at moderate risk for complications without adequate follow up.  This format is felt to be most appropriate for this patient at this time.  All issues noted in this document were discussed and addressed.  A limited physical exam was performed with this format.  Please refer to the patient's chart for her consent to telehealth for [facility].   Evaluation Performed:  Doxy[employer]me video visit  Date:  05/03/2019   ID:  Shannon Mora, DOB [birth year], MRN [id number]  Patient Location:  [place] [address]  Provider location:   [address], [place], [place] [address]  PCP:  Vernie Shanks, MD  Cardiologist:  No primary care provider on file. Electrophysiologist:  None   Chief Complaint:  Follow-up dyslipidemia  History of Present Illness:    Shannon Mora is a 58 y.o. female who presents via audio/video conferencing for a telehealth visit today.  Shannon Mora is here for follow-up.  Overall she seems to be doing well on Nexletol.  She denies any side effects that would make her consider discontinuing the drug.  Recent repeat labs showed reduction in LDL cholesterol which is significant from 173 down to 97.  This reduction is far in excess of what is expected with the medication is suggest there is a significant dietary component with that as well as recent weight loss which she mentioned that she has had as well and increased exercise.  Total cholesterol is now 175 down from 256 and her triglycerides however remain elevated at 300.  Blood pressure was somewhat elevated today as well.  We will need to keep an eye on that as her PCP recommended that we manage  that as well.   The patient does not have symptoms concerning for COVID-19 infection (fever, chills, cough, or new SHORTNESS OF BREATH).    Prior CV studies:   The following studies were reviewed today:  Chart reviewed, lab work  PMHx:  Past Medical History:  Diagnosis Date   Basal cell cancer    On forehead.   GERD (gastroesophageal reflux disease)    HSV-1 (herpes simplex virus 1) infection    Hyperglycemia    Hyperlipidemia    Perimenopausal    Vitamin D deficiency     Past Surgical History:  Procedure Laterality Date   BACK SURGERY     BREAST ENHANCEMENT SURGERY     CHOLECYSTECTOMY     TUBAL LIGATION      FAMHx:  Family History  Problem Relation Age of Onset   Heart disease Father     SOCHx:   reports that she has never smoked. She has never used smokeless tobacco. She reports current alcohol use of about 1.0 standard drinks of alcohol per week. She reports that she does not use drugs.  ALLERGIES:  Allergies  Allergen Reactions   Statins Other (See Comments)    Atorvastatin, rosuvastatin, simvastatin, ezetimibe    MEDS:  Current Meds  Medication Sig   Bempedoic Acid (NEXLETOL) 180 MG TABS Take 1 Dose by mouth daily.   Calcium Carbonate-Vit D-Min (CALCIUM 1200 PO) Take 1,000 mg by mouth.   Cholecalciferol (VITAMIN D3) 2000 units TABS Take by mouth.   Coenzyme  Q10 (CO Q10) 200 MG CAPS Co Q-10 200 mg capsule  Take by oral route.   Cyanocobalamin (B-12) 1000 MCG TABS Take 1 tablet by mouth once a week.   glucose blood (FREESTYLE LITE) test strip Use to check blood sugars one time daily   Lancets (FREESTYLE) lancets Use to check blood sugars one time daily   meloxicam (MOBIC) 15 MG tablet Take 1 tablet by mouth daily as needed.    metFORMIN (GLUCOPHAGE) 500 MG tablet TAKE 1 TABLET DAILY   Multiple Vitamins-Minerals (EYE VITAMINS PO) Take 1 capsule by mouth daily.     telmisartan (MICARDIS) 40 MG tablet Take 1 tablet (40 mg total)  by mouth daily.   [DISCONTINUED] tiZANidine (ZANAFLEX) 2 MG tablet TK 1 T PO Q 8 H PRF SPASMS     ROS: Pertinent items noted in HPI and remainder of comprehensive ROS otherwise negative.  Labs/Other Tests and Data Reviewed:    Recent Labs: 06/07/2018: ALT 11; BUN 15; Creat 0.74; Potassium 4.2; Sodium 141   Recent Lipid Panel Lab Results  Component Value Date/Time   CHOL 175 04/26/2019 08:57 AM   TRIG 300 (H) 04/26/2019 08:57 AM   TRIG 246 (H) 12/02/2013 12:39 PM   HDL 35 (L) 04/26/2019 08:57 AM   HDL 45 12/02/2013 12:39 PM   CHOLHDL 5.0 (H) 04/26/2019 08:57 AM   CHOLHDL 6 06/07/2018 08:19 AM   LDLCALC 139 (H) 01/07/2019 11:07 AM   LDLCALC 138 (H) 12/02/2013 12:39 PM   LDLDIRECT 97 04/26/2019 08:57 AM   LDLDIRECT 166.0 06/07/2018 08:19 AM    Wt Readings from Last 3 Encounters:  05/03/19 160 lb 6.4 oz (72.8 kg)  04/13/19 159 lb (72.1 kg)  02/21/19 166 lb (75.3 kg)     Exam:    Vital Signs:  BP (!) 151/81    Pulse 71    Ht 5\' 2"  (1.575 m)    Wt 160 lb 6.4 oz (72.8 kg)    LMP 10/07/2013    BMI 29.34 kg/m    General appearance: alert and no distress Lungs: No visual respiratory difficulty Abdomen: Mildly overweight Extremities: extremities normal, atraumatic, no cyanosis or edema Skin: Skin color, texture, turgor normal. No rashes or lesions Neurologic: Mental status: Alert, oriented, thought content appropriate Psych: Pleasant, mildly anxious  ASSESSMENT & PLAN:    1. Mixed dyslipidemia (high LDL, triglycerides and low HDL) 2. Type 2 diabetes 3. Family history of premature coronary disease - Zero (0) CAC 4. Statin intolerance 5. Essential hypertension  Shannon Mora has a mixed dyslipidemia with improvement in LDL which is been significant with dietary changes, weight loss and increased activity as well as the addition of Nexletol.  Her blood sugars have improved as well.  Recent hemoglobin A1c is 6.2 which has progressively improved.  Her triglycerides remain  elevated however that is considered a secondary target and given her 0 coronary calcium score, I would be inclined to continue to work with the dietary changes first.  Ultimately we may consider a fibrate which could be additionally helpful for her diabetes, however is not associate with any significant cardiovascular risk reduction.  I have advised her to continue to monitor blood pressure and we may need to add additional agents to target her to 120/80 or lower.  I would consider possibly adding hydrochlorothiazide to her telmisartan in the future.  Follow-up with me in 6 months with a repeat lipid profile.  COVID-19 Education: The signs and symptoms of COVID-19 were discussed with the  patient and how to seek care for testing (follow up with PCP or arrange E-visit).  The importance of social distancing was discussed today.  Patient Risk:   After full review of this patients clinical status, I feel that they are at least moderate risk at this time.  Time:   Today, I have spent 25 minutes with the patient with telehealth technology discussing dyslipidemia, hypertension, type 2 diabetes.     Medication Adjustments/Labs and Tests Ordered: Current medicines are reviewed at length with the patient today.  Concerns regarding medicines are outlined above.   Tests Ordered: Orders Placed This Encounter  Procedures   Lipid panel   LDL cholesterol, direct    Medication Changes: No orders of the defined types were placed in this encounter.   Disposition:  in 6 month(s)  Pixie Casino, MD, [facility], [place] Director of the Advanced Lipid Disorders &  Cardiovascular Risk Reduction Clinic Diplomate of the American Board of Clinical Lipidology Attending Cardiologist  Direct Dial: [phone]   Fax: [phone]  Website:  [email or web address]  Pixie Casino, MD  05/03/2019 5:33 PM

## 2019-05-03 NOTE — Patient Instructions (Addendum)
Medication Instructions:  Your physician recommends that you continue on your current medications as directed. Please refer to the Current Medication list given to you today.  If you need a refill on your cardiac medications before your next appointment, please call your pharmacy.   Lab work: FASTING lab work in 3 months to check cholesterol - lab order will be mailed  If you have labs (blood work) drawn today and your tests are completely normal, you will receive your results only by: Marland Kitchen MyChart Message (if you have MyChart) OR . A paper copy in the mail If you have any lab test that is abnormal or we need to change your treatment, we will call you to review the results.  Testing/Procedures: NONE  Follow-Up: . Dr. Debara Pickett recommends that you schedule a follow up visit with him the in the [place] in 6 months. Please call our office in January 2021 to schedule for March 2021   Any Other Special Instructions Will Be Listed Below (If Applicable).  Please check BP at home and keep a log. Contact us with readings in a couple of weeks

## 2019-06-10 IMAGING — CT CT HEART SCORING
2 series · 16 of 20 positions shown, 18 images · non-contrast
Comparison: None.

Addendum:
EXAM:
OVER-READ INTERPRETATION  CT CHEST

The following report is an over-read performed by radiologist Dr.
Dhirah Salha [REDACTED] on 07/01/2018. This
over-read does not include interpretation of cardiac or coronary
anatomy or pathology. The coronary calcium score interpretation by
the cardiologist is attached.
CLINICAL DATA: Risk stratification
Coronary Calcium Score
TECHNIQUE: The patient was scanned on a Siemens Somatom 64 slice scanner. Axial
non-contrast 3 mm slices were carried out through the heart. The
data set was analyzed on a dedicated work station and scored using
the Agatson method.

[Series 2: casc 3.0 i36f 2 bestdiast 69 % · axial · 0.33mm/px · z∈[-235,-139]mm · 8 of 42 slices shown, 10 images]
[im 5/42  vessel]
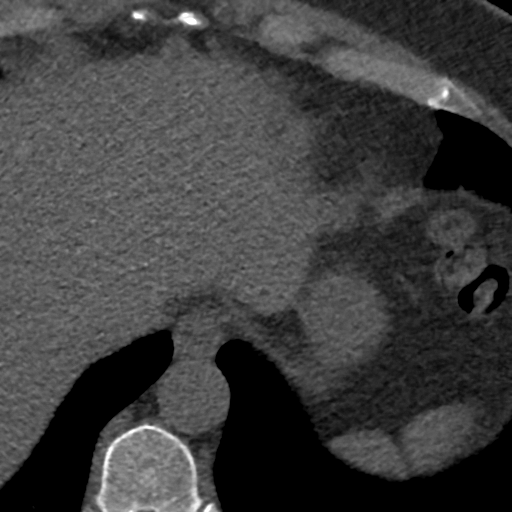
[im 5/42  lung]
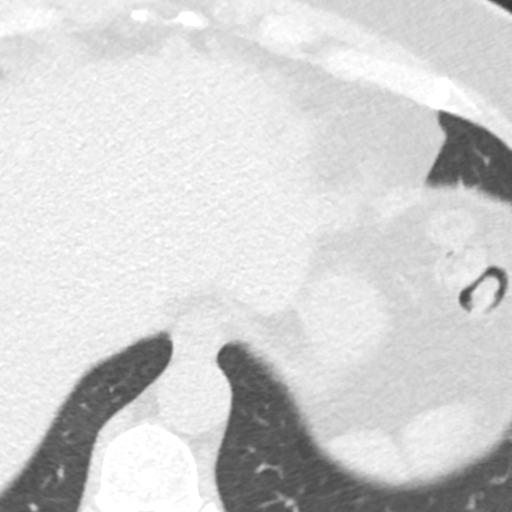
[im 10/42  vessel]
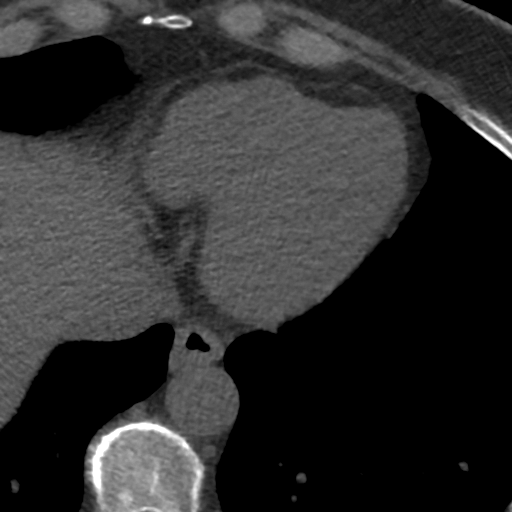
[im 14/42  vessel]
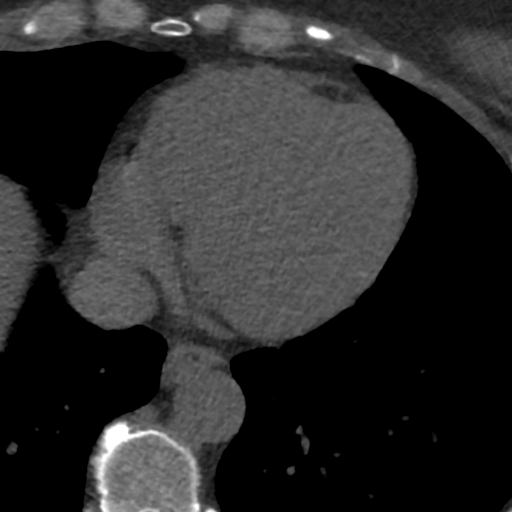
[im 19/42  vessel]
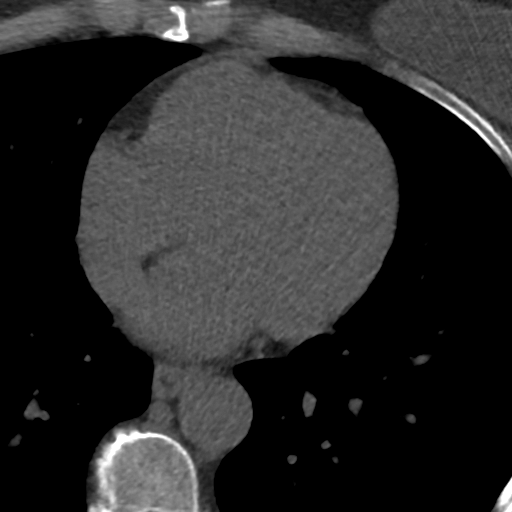
[im 23/42  vessel]
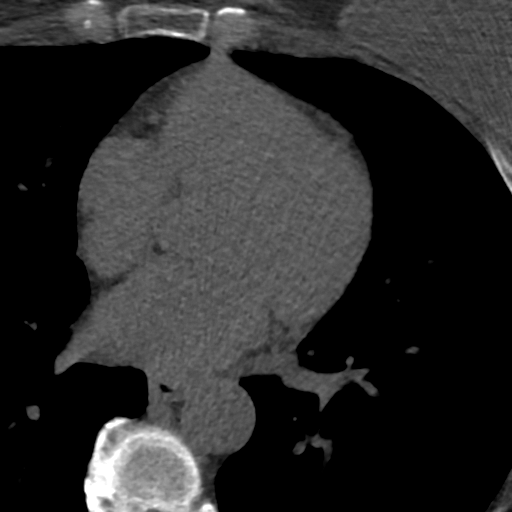
[im 23/42  lung]
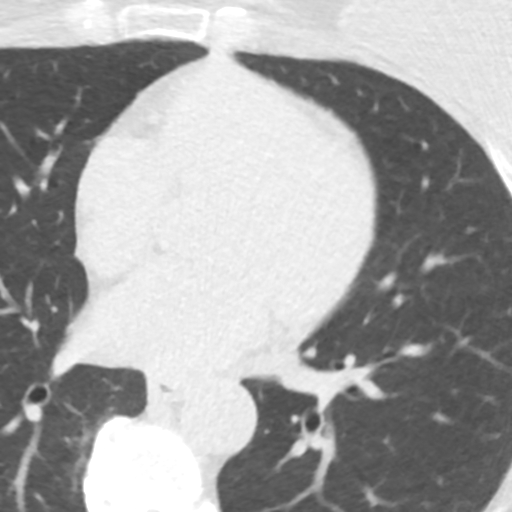
[im 28/42  vessel]
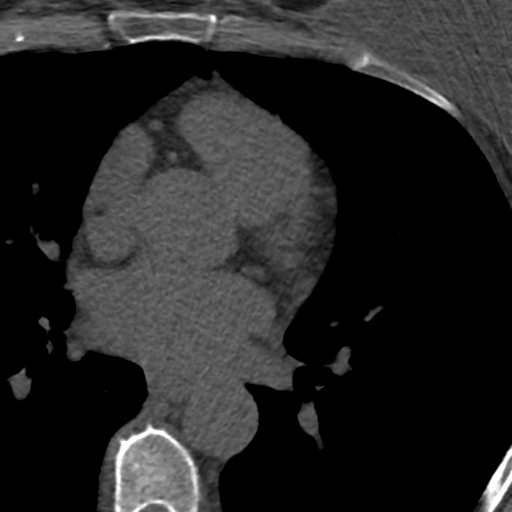
[im 32/42  vessel]
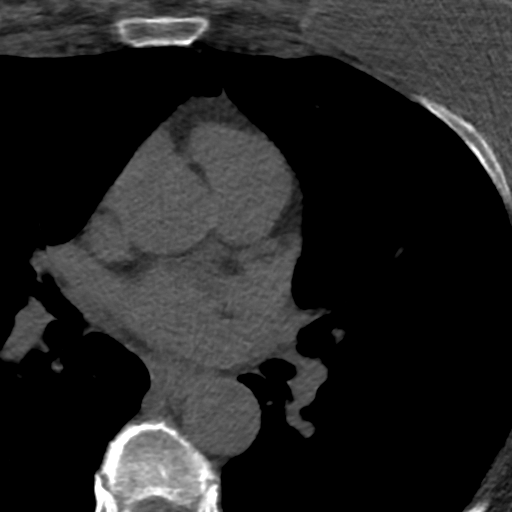
[im 37/42  vessel]
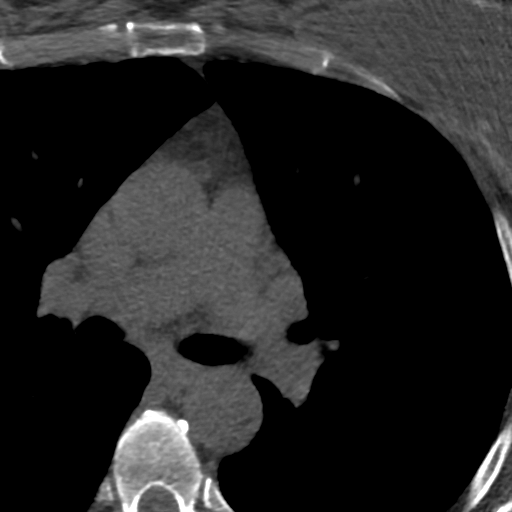

[Series 4: lung st 69 % · axial · 0.66mm/px · z∈[-235,-139]mm · 8 of 42 slices shown]
[im 5/42  lung]
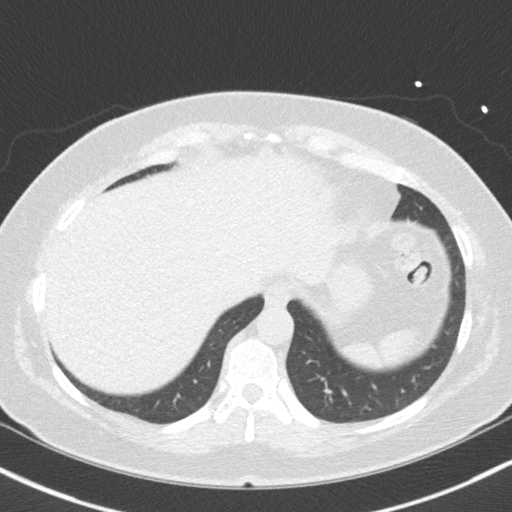
[im 10/42  lung]
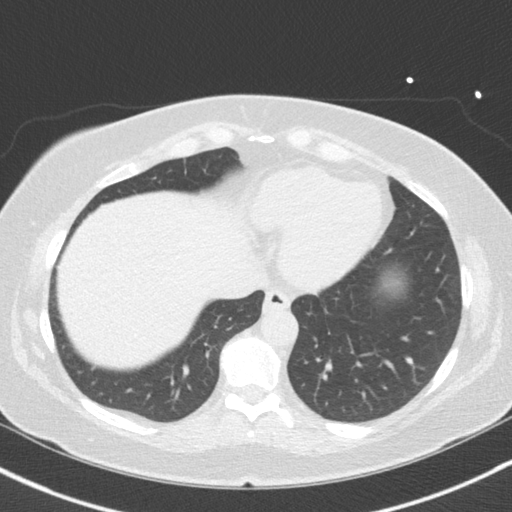
[im 14/42  lung]
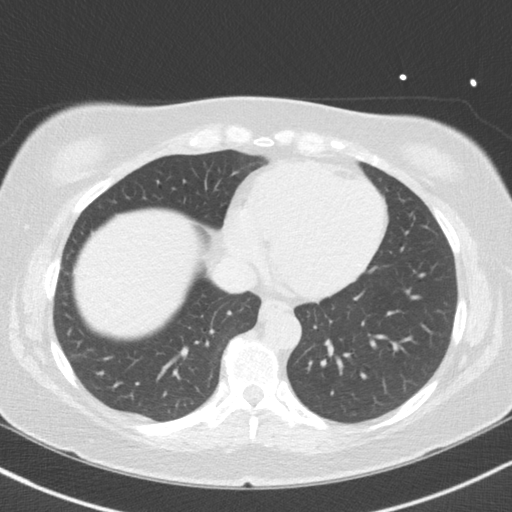
[im 19/42  lung]
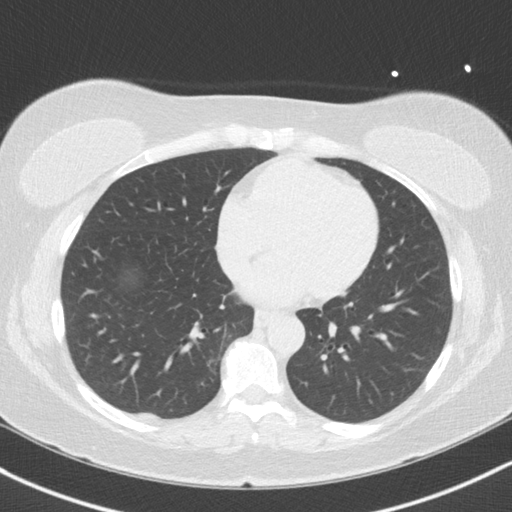
[im 23/42  lung]
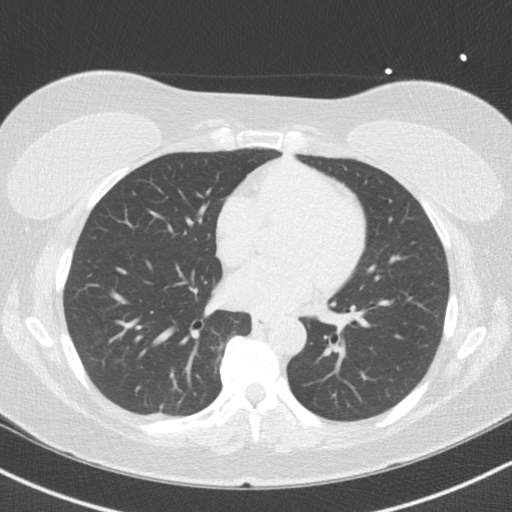
[im 28/42  lung]
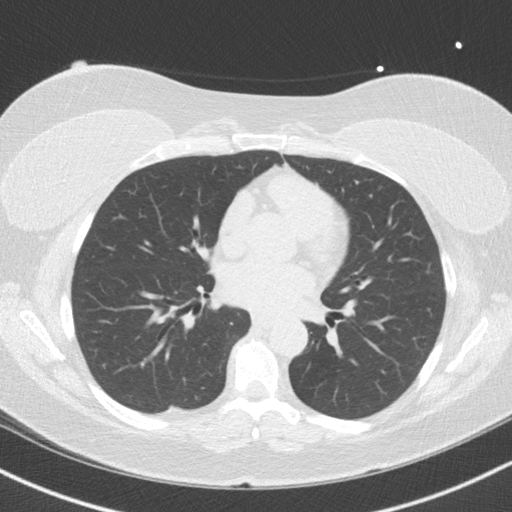
[im 32/42  lung]
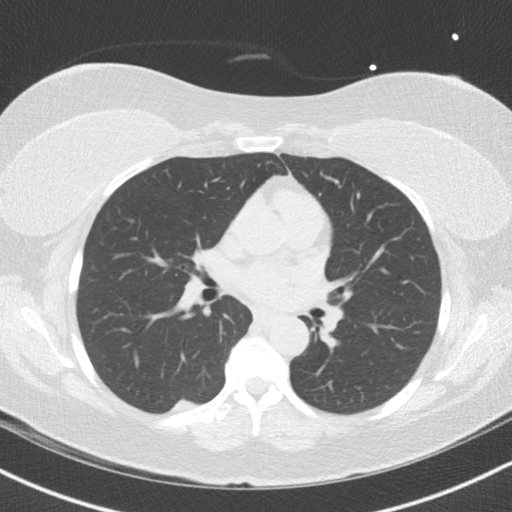
[im 37/42  lung]
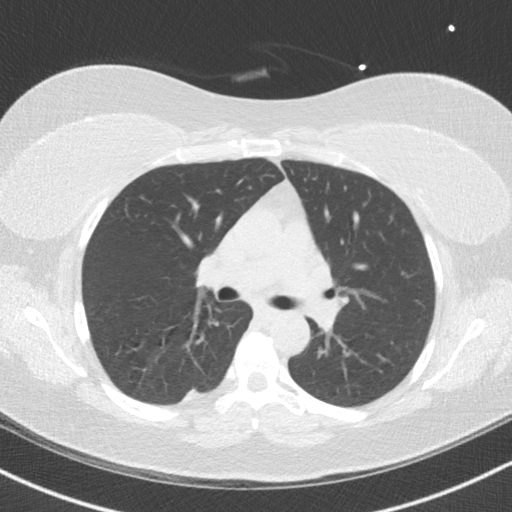

[16 of 20 positions shown; findings below may reference images not displayed]

FINDINGS: Small amount of pleural thickening calcification in the posterior
aspect of the right hemithorax. Within the visualized portions of
the thorax there are no suspicious appearing pulmonary nodules or
masses, there is no acute consolidative airspace disease, no pleural
effusions, no pneumothorax and no lymphadenopathy. Visualized
portions of the upper abdomen are unremarkable. There are no
aggressive appearing lytic or blastic lesions noted in the
visualized portions of the skeleton. Bilateral breast implants are
incidentally noted.
IMPRESSION: No significant incidental noncardiac findings are noted.
FINDINGS: Non-cardiac: See separate report from [REDACTED].

Ascending aorta: Normal diameter 2.9 cm

Pericardium: Normal

Coronary arteries: No calcium noted
IMPRESSION: Coronary calcium score of 0.

Jaylon Aujla

*** End of Addendum ***

## 2019-08-24 LAB — LIPID PANEL
Chol/HDL Ratio: 4.6 ratio — ABNORMAL HIGH (ref 0.0–4.4)
Cholesterol, Total: 183 mg/dL (ref 100–199)
HDL: 40 mg/dL (ref >39)
LDL Chol Calc (NIH): 100 mg/dL — ABNORMAL HIGH (ref 0–99)
Triglycerides: 253 mg/dL — ABNORMAL HIGH (ref 0–149)
VLDL Cholesterol Cal: 43 mg/dL — ABNORMAL HIGH (ref 5–40)

## 2019-08-24 LAB — LDL CHOLESTEROL, DIRECT: LDL Direct: 104 mg/dL — ABNORMAL HIGH (ref 0–99)

## 2019-08-25 DIAGNOSIS — Z789 Other specified health status: Secondary | ICD-10-CM

## 2019-08-25 DIAGNOSIS — E785 Hyperlipidemia, unspecified: Secondary | ICD-10-CM

## 2019-09-02 ENCOUNTER — Telehealth: Payer: Self-pay | Admitting: *Deleted

## 2019-09-02 DIAGNOSIS — I10 Essential (primary) hypertension: Secondary | ICD-10-CM

## 2019-09-02 MED ORDER — HYDROCHLOROTHIAZIDE 12.5 MG PO CAPS: 12.5000 mg | ORAL_CAPSULE | Freq: Every day | ORAL | 3 refills | Status: DC

## 2019-09-02 NOTE — Telephone Encounter (Signed)
Left message for patient that prescription was sent to the pharmacy.

## 2019-09-02 NOTE — Telephone Encounter (Signed)
-----   Message from Pixie Casino, MD sent at 09/02/2019  2:38 PM EST ----- Regarding: BP Can you please Rx for hctz 12.5 mg daily.  Thanks.  Dr Lemmie Evens

## 2019-10-14 ENCOUNTER — Ambulatory Visit (INDEPENDENT_AMBULATORY_CARE_PROVIDER_SITE_OTHER): Admitting: Internal Medicine

## 2019-10-14 ENCOUNTER — Encounter: Payer: Self-pay | Admitting: Internal Medicine

## 2019-10-14 DIAGNOSIS — E663 Overweight: Secondary | ICD-10-CM | POA: Diagnosis not present

## 2019-10-14 DIAGNOSIS — E785 Hyperlipidemia, unspecified: Secondary | ICD-10-CM

## 2019-10-14 DIAGNOSIS — E119 Type 2 diabetes mellitus without complications: Secondary | ICD-10-CM | POA: Diagnosis not present

## 2019-10-14 MED ORDER — METFORMIN HCL 500 MG PO TABS: 500.0000 mg | ORAL_TABLET | Freq: Every day | ORAL | 4 refills | Status: DC

## 2019-10-14 NOTE — Patient Instructions (Signed)
Please continue Metformin 500 mg daily.  Please return in 6 months with your sugar log.

## 2019-10-14 NOTE — Progress Notes (Signed)
Patient ID: Shannon Mora, female   DOB: 11/03/1960, 58 y.o.   MRN: [id number]  Patient location: Home My location: Office Persons participating in the virtual visit: patient, provider  Referring Provider: Vernie Shanks, MD  I connected with the patient on 10/14/19 at 10:42 AM EST by a video enabled telemedicine application and verified that I am speaking with the correct person.   I discussed the limitations of evaluation and management by telemedicine and the availability of in person appointments. The patient expressed understanding and agreed to proceed.   Details of the encounter are shown below.  HPI: Shannon Mora is a 59 y.o.-year-old female, returning for follow-up for DM2, dx in 2015, prev. GDM 1989, 1992, non-insulin-dependent, controlled, without long-term complications and also hyperlipidemia.  Last visit 6 months ago.  She continues on a mostly plant-based diet. She gained 4 lbs since last visit.  DM2: Reviewed HbA1c levels: Lab Results  Component Value Date   HGBA1C 6.2 (A) 04/13/2019   HGBA1C 6.4 (A) 06/07/2018   HGBA1C 6.6 (A) 01/28/2018  07/07/2017: HbA1c 6.8% 03/03/2017: HbA1c 6.5% 10/30/2016: HbA1c 6.6%  Patient is on: - Metformin 500 mg daily in a.m.  She is checking her sugars once a day: - am: n/c >> 89-127 >> 78-81 >> 84-113 >> 88-99, 114 - 2h after b'fast: n/c >> 130 >> 101 >> n/c - before lunch: n/c >> 83-109 >> n/c >> 89-93 >> 83, 87-103 - 2h after lunch: n/c >> 124 >> 114 >> n/c - before dinner: n/c >> 95 >> n/c >> 92-101, 113 >> 88-99 - 2h after dinner: n/c >> 180 >> 122 >> n/c - bedtime: n/c >> 146 - nighttime: n/c Lowest sugar was 78 >> 84 >> 83; it is unclear at which level she has hypoglycemia awareness. Highest sugar was 180 >> 146 >> 114.  Glucometer: AccuCheck  Pt's meals are: - Breakfast: egg white, vegan links, coffee - Lunch: protein shake >> now salad + [place] breast (1/4 cup) - Dinner: vegetarian dish or fish once a week,  occasional poultry >> veggies - no carbs - Snacks: 1-2  -No CKD, last BUN/creatinine:  Lab Results  Component Value Date   BUN 15 06/07/2018   BUN 7 12/02/2013   CREATININE 0.74 06/07/2018   CREATININE 0.63 12/02/2013  07/07/2017: Glucose 108, BUN/creatinine 14/0.75 She is on telmisartan. - last eye exam was on 01/2019: No DR, + macular degeneration (pseudovitelliform dystrophy). Dr. Lady Mora and Dr. Zigmund Mora. - no numbness and tingling in her feet.  Pt has FH of DM in brother.  Hyperlipidemia: -+ Familial hyperlipidemia: Lab Results  Component Value Date   CHOL 183 08/24/2019   HDL 40 08/24/2019   LDLCALC 100 (H) 08/24/2019   LDLDIRECT 104 (H) 08/24/2019   TRIG 253 (H) 08/24/2019   CHOLHDL 4.6 (H) 08/24/2019  07/07/2017: 264/167/43/187 03/03/2017: 239/158/43/165  10/30/2016: 128/149/47/51 06/30/2016: 237/308/41/134 We tried fluvastatin XL (after preauthorization).  She could not tolerate this due to muscle aches and joint aches even when she was taking it every 3 days. She was on ezetimibe >> mm pain She is intolerant to statins: tried Lipitor, low-dose Crestor >> mm pain.  However, Crestor worked great for her >> LDL decreased to 51. She retried this 2020 >> joint pain She tried Fenofibrate and also Cholestyramine >> did not work. She is now seen in the lipid clinic-she was started on Bempedoic acid as a PCSK9 inhibitor was not covered by [employer].  No history of hypothyroidism: Lab Results  Component  Value Date   TSH 1.770 August 01, 2013   Vitamin D levels were normal: Lab Results  Component Value Date   VD25OH 32.8 12/02/2013   VD25OH 39.0 2013-08-01   Father died in his 58s from heart ds.  She has multiple relatives on father's side with hyperlipidemia and heart disease.  She was started on HCTZ by Dr. Debara Mora.  On Celexa.   ROS: Constitutional: + weight gain/no weight loss, no fatigue, no subjective hyperthermia, no subjective hypothermia Eyes: no blurry vision,  no xerophthalmia ENT: no sore throat, no nodules palpated in neck, no dysphagia, no odynophagia, no hoarseness Cardiovascular: no CP/no SOB/no palpitations/no leg swelling Respiratory: no cough/no SOB/no wheezing Gastrointestinal: no N/no V/no D/no C/no acid reflux Musculoskeletal: no muscle aches/no joint aches Skin: no rashes, no hair loss Neurological: no tremors/no numbness/no tingling/no dizziness  I reviewed pt's medications, allergies, PMH, social hx, family hx, and changes were documented in the history of present illness. Otherwise, unchanged from my initial visit note.  Surgeries: - tubal ligation sx 1992 - gall bladder sx - low lumbar sx  Past Medical History:  Diagnosis Date  . Basal cell cancer    On forehead.  Marland Kitchen GERD (gastroesophageal reflux disease)   . HSV-1 (herpes simplex virus 1) infection   . Hyperglycemia   . Hyperlipidemia   . Perimenopausal   . Vitamin D deficiency    Social History   Socioeconomic History  . Marital status: Married    Spouse name: Not on file  . Number of children: 2  Social Needs  Occupational History  . Not on file  Tobacco Use  . Smoking status: Never Smoker  . Smokeless tobacco: Never Used  Substance and Sexual Activity  . Alcohol use: Yes    Alcohol/week: 0.6 oz    Types: 1 Glasses of hard cider per mo    Comment: monthly  . Drug use: No   Current Outpatient Medications on File Prior to Visit  Medication Sig Dispense Refill  . Bempedoic Acid (NEXLETOL) 180 MG TABS Take 1 Dose by mouth daily. 90 tablet 3  . Calcium Carbonate-Vit D-Min (CALCIUM 1200 PO) Take 1,000 mg by mouth.    . Cholecalciferol (VITAMIN D3) 2000 units TABS Take by mouth.    . Coenzyme Q10 (CO Q10) 200 MG CAPS Co Q-10 200 mg capsule  Take by oral route.    . Cyanocobalamin (B-12) 1000 MCG TABS Take 1 tablet by mouth once a week.    Marland Kitchen glucose blood (FREESTYLE LITE) test strip Use to check blood sugars one time daily 100 each 12  . hydrochlorothiazide  (MICROZIDE) 12.5 MG capsule Take 1 capsule (12.5 mg total) by mouth daily. 90 capsule 3  . Lancets (FREESTYLE) lancets Use to check blood sugars one time daily 100 each 12  . meloxicam (MOBIC) 15 MG tablet Take 1 tablet by mouth daily as needed.   2  . metFORMIN (GLUCOPHAGE) 500 MG tablet TAKE 1 TABLET DAILY 90 tablet 4  . Multiple Vitamins-Minerals (EYE VITAMINS PO) Take 1 capsule by mouth daily.      Marland Kitchen telmisartan (MICARDIS) 40 MG tablet Take 1 tablet (40 mg total) by mouth daily. 90 tablet 3   No current facility-administered medications on file prior to visit.   Allergies  Allergen Reactions  . Statins Other (See Comments)    Atorvastatin, rosuvastatin, simvastatin, ezetimibe   Family History  Problem Relation Age of Onset  . Heart disease Father     PE: Today: 135/79, P  76, 163.4 lbs LMP 10/07/2013  Wt Readings from Last 3 Encounters:  05/03/19 160 lb 6.4 oz (72.8 kg)  04/13/19 159 lb (72.1 kg)  02/21/19 166 lb (75.3 kg)   Constitutional:  in NAD  The physical exam was not performed (virtual visit).  ASSESSMENT: 1. DM2, non-insulin-dependent, controlled, without long term complications  2. HL  3.  Overweight  PLAN:  1. Patient with controlled diabetes, on minimal Metformin dose.  She is following a mostly plant-based diet, of which, her sugars improved.  Last HbA1c was excellent, at 6.2%. -At this visit, sugars are excellent, all at goal -No need to change her regimen for now -Refilled Metformin - I suggested to:  Patient Instructions  Please continue Metformin 500 mg daily.  Please return in 6 months with your sugar log.   - we will recheck her HbA1c when she returns to the clinic - advised to check sugars at different times of the day - 1x a day, rotating check times - advised for yearly eye exams >> she is UTD - return to clinic in 6 months  2. HL - pt with familial hyperlipidemia, with history of early heart disease in her father, in his early 71s.  He  had previous intolerances to Lipitor and Crestor, even at low doses, despite improvement in her lipid panels.  She had increased muscle aches with Crestor.  She tried fenofibrate and bile acid sequestrants but these did not work for her.  We also try fluvastatin XL but she could not tolerate this even if she was taking it every 3 days.  I therefore referred her to the lipid clinic in the past -She is now on Bempedoic acid as [employer] refused to cover PCSK9 inhibitor for her.  She tolerates this well. - Reviewed latest lipid panel and LDL was higher, after she reintroduced ground [place].  Since then, she cut this out of her diet. Lab Results  Component Value Date   CHOL 183 08/24/2019   HDL 40 08/24/2019   LDLCALC 100 (H) 08/24/2019   LDLDIRECT 104 (H) 08/24/2019   TRIG 253 (H) 08/24/2019   CHOLHDL 4.6 (H) 08/24/2019   3.  Overweight -Before last visit, her weight started to improve on a more plant-based diet and she lost 9 pounds. -At this visit, after she relaxed her diet she gained approximately 4 pounds, but she cut out meat again now.  She also plans to be more active after the weather improves.  Philemon Kingdom, MD PhD [facility] Endocrinology

## 2019-10-17 ENCOUNTER — Encounter: Payer: Self-pay | Admitting: Internal Medicine

## 2019-10-28 LAB — LIPID PANEL
Chol/HDL Ratio: 4.9 ratio — ABNORMAL HIGH (ref 0.0–4.4)
Cholesterol, Total: 177 mg/dL (ref 100–199)
HDL: 36 mg/dL — ABNORMAL LOW (ref >39)
LDL Chol Calc (NIH): 95 mg/dL (ref 0–99)
Triglycerides: 273 mg/dL — ABNORMAL HIGH (ref 0–149)
VLDL Cholesterol Cal: 46 mg/dL — ABNORMAL HIGH (ref 5–40)

## 2019-10-28 LAB — LDL CHOLESTEROL, DIRECT: LDL Direct: 105 mg/dL — ABNORMAL HIGH (ref 0–99)

## 2019-10-31 ENCOUNTER — Other Ambulatory Visit: Payer: Self-pay

## 2019-10-31 ENCOUNTER — Encounter: Payer: Self-pay | Admitting: Internal Medicine

## 2019-10-31 ENCOUNTER — Ambulatory Visit (INDEPENDENT_AMBULATORY_CARE_PROVIDER_SITE_OTHER): Admitting: Internal Medicine

## 2019-10-31 VITALS — BP 138/80 | HR 81 | Temp 97.0°F | Ht 63.0 in | Wt 165.0 lb

## 2019-10-31 DIAGNOSIS — E785 Hyperlipidemia, unspecified: Secondary | ICD-10-CM | POA: Diagnosis not present

## 2019-10-31 DIAGNOSIS — I10 Essential (primary) hypertension: Secondary | ICD-10-CM

## 2019-10-31 DIAGNOSIS — T466X5A Adverse effect of antihyperlipidemic and antiarteriosclerotic drugs, initial encounter: Secondary | ICD-10-CM

## 2019-10-31 DIAGNOSIS — E119 Type 2 diabetes mellitus without complications: Secondary | ICD-10-CM | POA: Diagnosis not present

## 2019-10-31 DIAGNOSIS — M791 Myalgia, unspecified site: Secondary | ICD-10-CM | POA: Diagnosis not present

## 2019-10-31 NOTE — Patient Instructions (Signed)
Medication Instructions:  Your physician recommends that you continue on your current medications as directed. Please refer to the Current Medication list given to you today.  *If you need a refill on your cardiac medications before your next appointment, please call your pharmacy*   Lab Work: FASTING lab work to be completed before your next visit  If you have labs (blood work) drawn today and your tests are completely normal, you will receive your results only by: Marland Kitchen MyChart Message (if you have MyChart) OR . A paper copy in the mail If you have any lab test that is abnormal or we need to change your treatment, we will call you to review the results.   Testing/Procedures: NONE   Follow-Up: At [facility], you and your health needs are our priority.  As part of our continuing mission to provide you with exceptional heart care, we have created designated Provider Care Teams.  These Care Teams include your primary Cardiologist (physician) and Advanced Practice Providers (APPs -  Physician Assistants and Nurse Practitioners) who all work together to provide you with the care you need, when you need it.  We recommend signing up for the patient portal called "MyChart".  Sign up information is provided on this After Visit Summary.  MyChart is used to connect with patients for Virtual Visits (Telemedicine).  Patients are able to view lab/test results, encounter notes, upcoming appointments, etc.  Non-urgent messages can be sent to your provider as well.   To learn more about what you can do with MyChart, go to [email or web address].    Your next appointment:   6 month(s) - lipid clinic  The format for your next appointment:   In Person  Provider:   K. Mali Hilty, MD   Other Instructions

## 2019-10-31 NOTE — Progress Notes (Signed)
Lipid Clinic Consult Note  Chief Complaint:  Manage dyslipidemia  Primary Care Physician: Vernie Shanks, MD  HPI:  Shannon Mora is a 59 y.o. female who is being seen today for the evaluation of dyslipidemia at the request of Vernie Shanks, MD.  This is a pleasant 59 year old female with type 2 diabetes followed by Dr. Cruzita Lederer in endocrinology.  She has been noted to have difficult to control dyslipidemia with primarily elevated triglycerides as well as high LDL.  Her last direct LDL 10 days ago was 166.  Total cholesterol was 233, triglycerides 378 and HDL of 38.5.  Non-HDL cholesterol was 194 (approximately 30 points higher as expected then LDL cholesterol, suggesting concordance).  Unfortunately, Ms. Allmendinger has had difficulty with medications.  She has had side effects with multiple statin medications including atorvastatin, rosuvastatin, simvastatin, ezetimibe, and alternatives such as the fibrates.  Her A1c is well controlled at 6.4.  She is also not an alcohol user.  There is family history of early onset heart disease in her father.  Really she is asymptomatic, denying any chest pain, worsening shortness of breath or associated symptoms.  Her statin side effects and not only included myalgias but significant back pain and joint stiffness.  She also had symptoms with fenofibrate and cholestyramine.  She did get some improvement in her LDL cholesterol on rosuvastatin, but again it was not tolerated.  02/21/2019  Ms. Scarpone is seen here for follow-up.  She had a marked dyslipidemia as above and underwent CT coronary calcium scoring.  Fortunately showed 0 coronary calcium score.  This is associated with low risk of events.  We had recommended pursuing PCSK9 inhibitor therapy given her intolerance to statins however this was declined due to lack of ASCVD.  Based on this, elected to pursue therapy with a newly available Nexletol.  She has been taking this now for a month and seems to be  tolerating it well.  Although it is only moderate potency, this should be associated with some improvement in her symptoms.  Optionally, we may be able to add ezetimibe to this therapy.  She apparently had had side effects to this however it may have been in combination with simvastatin and the statin is certainly more likely to cause her side effects.  Also today, she noted that blood pressures been elevated.  Although we have been seeing her for dyslipidemia, her PCP requested that we evaluated her for hypertension.  I reviewed blood pressures over the last couple of years which showed generally blood pressures between [id number] systolic.  As a diabetic, she would benefit for them renal protection with ACE or ARB.  10/31/2019  Ms. Parrow returns today for follow-up.  She has persistently elevated triglycerides now 273.  Total cholesterol 177, HDL 36 and LDL at target at 95.  This is based on a 0 calcium score therefore I felt that we did not need to be quite as aggressive with her risk modification.  Her diabetes has been very well controlled.  She sees endocrinology and her A1c was somewhere around 6.2.  We talked about options for lowering her triglycerides, including the fibrate and or Vascepa.  There is little evidence for risk reduction with a fibrate however as diabetic with at least 2 risk factors, she could qualify for Vascepa.  Despite this, she is not interested in additional medications at this time.  She wants to work on more physical activity and weight loss.  PMHx:  Past Medical History:  Diagnosis Date  . Basal cell cancer    On forehead.  Marland Kitchen GERD (gastroesophageal reflux disease)   . HSV-1 (herpes simplex virus 1) infection   . Hyperglycemia   . Hyperlipidemia   . Perimenopausal   . Vitamin D deficiency      FAMHx:  Family History  Problem Relation Age of Onset  . Heart disease Father     SOCHx:   reports that she has never smoked. She has never used smokeless tobacco. She  reports current alcohol use of about 1.0 standard drinks of alcohol per week. She reports that she does not use drugs.  ALLERGIES:  Allergies  Allergen Reactions  . Statins Other (See Comments)    Atorvastatin, rosuvastatin, simvastatin, ezetimibe    ROS: Pertinent items noted in HPI and remainder of comprehensive ROS otherwise negative.  HOME MEDS: Current Outpatient Medications on File Prior to Visit  Medication Sig Dispense Refill  . Bempedoic Acid (NEXLETOL) 180 MG TABS Take 1 Dose by mouth daily. 90 tablet 3  . Calcium Carbonate-Vit D-Min (CALCIUM 1200 PO) Take 1,000 mg by mouth.    . Cholecalciferol (VITAMIN D3) 2000 units TABS Take by mouth.    . Coenzyme Q10 (CO Q10) 200 MG CAPS Co Q-10 200 mg capsule  Take by oral route.    . Cyanocobalamin (B-12) 1000 MCG TABS Take 1 tablet by mouth once a week.    Marland Kitchen glucose blood (FREESTYLE LITE) test strip Use to check blood sugars one time daily 100 each 12  . hydrochlorothiazide (MICROZIDE) 12.5 MG capsule Take 1 capsule (12.5 mg total) by mouth daily. 90 capsule 3  . Lancets (FREESTYLE) lancets Use to check blood sugars one time daily 100 each 12  . metFORMIN (GLUCOPHAGE) 500 MG tablet Take 1 tablet (500 mg total) by mouth daily. 90 tablet 4  . Multiple Vitamins-Minerals (EYE VITAMINS PO) Take 1 capsule by mouth daily.      Marland Kitchen telmisartan (MICARDIS) 40 MG tablet Take 1 tablet (40 mg total) by mouth daily. 90 tablet 3   No current facility-administered medications on file prior to visit.    LABS/IMAGING: No results found for this or any previous visit (from the past 48 hour(s)). No results found.  LIPID PANEL:    Component Value Date/Time   CHOL 177 10/28/2019 1116   TRIG 273 (H) 10/28/2019 1116   TRIG 246 (H) 12/02/2013 1239   HDL 36 (L) 10/28/2019 1116   HDL 45 12/02/2013 1239   CHOLHDL 4.9 (H) 10/28/2019 1116   CHOLHDL 6 06/07/2018 0819   VLDL 75.6 (H) 06/07/2018 0819   LDLCALC 95 10/28/2019 1116   LDLCALC 138 (H)  12/02/2013 1239   LDLDIRECT 105 (H) 10/28/2019 1116   LDLDIRECT 166.0 06/07/2018 0819    WEIGHTS: Wt Readings from Last 3 Encounters:  10/31/19 165 lb (74.8 kg)  05/03/19 160 lb 6.4 oz (72.8 kg)  04/13/19 159 lb (72.1 kg)    VITALS: BP 138/80 (BP Location: Left Arm, Patient Position: Sitting, Cuff Size: Normal)   Pulse 81   Temp (!) 97 F (36.1 C)   Ht 5\' 3"  (1.6 m)   Wt 165 lb (74.8 kg)   LMP 10/07/2013   BMI 29.23 kg/m   EXAM: Deferred  EKG: Deferred  ASSESSMENT: 1. Mixed dyslipidemia (high LDL, triglycerides and low HDL) 2. Type 2 diabetes 3. Family history of premature coronary disease - Zero (0) CAC 4. Statin intolerance - myalgias 5. Essential hypertension  PLAN: 1.   Ms.  Brubacher has stable lipids but triglycerides remain elevated.  As a diabetic with at least 2 risk factors, she would fit treatment criteria with Vascepa for cardiovascular risk reduction.  She is not interested in additional medication at this time.  We will revisit this issue in about 6 months and allow her to work on more physical activity and additional dietary changes.  Since her A1c is well controlled, I suspect that her triglycerides will not improve significantly unless she is very inactive in which case exercise can make a difference.  She is tolerating Nexletol 180 mg daily without statin side effects and her LDL is at target. Blood pressure is at goal - recent addition of HCTZ is being tolerated. A1c was 6.2.  Follow-up with me in 6 months.  Pixie Casino, MD, [facility] [place] Director of the Advanced Lipid Disorders &  Cardiovascular Risk Reduction Clinic Diplomate of the American Board of Clinical Lipidology Attending Cardiologist  Direct Dial: [phone]  Fax: [phone]  Website:  [email or web address] 10/31/2019, 2:02 PM

## 2019-12-14 ENCOUNTER — Other Ambulatory Visit: Payer: Self-pay | Admitting: Internal Medicine

## 2019-12-15 NOTE — Telephone Encounter (Signed)
Rx(s) sent to pharmacy electronically.  

## 2020-01-29 ENCOUNTER — Other Ambulatory Visit: Payer: Self-pay | Admitting: Internal Medicine

## 2020-01-30 NOTE — Telephone Encounter (Signed)
Rx request sent to pharmacy.  

## 2020-04-02 ENCOUNTER — Telehealth: Payer: Self-pay | Admitting: Internal Medicine

## 2020-04-02 DIAGNOSIS — M791 Myalgia, unspecified site: Secondary | ICD-10-CM

## 2020-04-02 DIAGNOSIS — E119 Type 2 diabetes mellitus without complications: Secondary | ICD-10-CM

## 2020-04-02 DIAGNOSIS — E785 Hyperlipidemia, unspecified: Secondary | ICD-10-CM

## 2020-04-02 DIAGNOSIS — Z789 Other specified health status: Secondary | ICD-10-CM

## 2020-04-02 NOTE — Telephone Encounter (Signed)
Lipid panel & direct LDL ordered mychart message sent to patient w/reminder

## 2020-04-24 LAB — LIPID PANEL
Chol/HDL Ratio: 6.5 ratio — ABNORMAL HIGH (ref 0.0–4.4)
Cholesterol, Total: 233 mg/dL — ABNORMAL HIGH (ref 100–199)
HDL: 36 mg/dL — ABNORMAL LOW (ref >39)
LDL Chol Calc (NIH): 145 mg/dL — ABNORMAL HIGH (ref 0–99)
Triglycerides: 284 mg/dL — ABNORMAL HIGH (ref 0–149)
VLDL Cholesterol Cal: 52 mg/dL — ABNORMAL HIGH (ref 5–40)

## 2020-04-24 LAB — LDL CHOLESTEROL, DIRECT: LDL Direct: 150 mg/dL — ABNORMAL HIGH (ref 0–99)

## 2020-05-01 ENCOUNTER — Encounter: Payer: Self-pay | Admitting: Internal Medicine

## 2020-05-01 ENCOUNTER — Ambulatory Visit (INDEPENDENT_AMBULATORY_CARE_PROVIDER_SITE_OTHER): Admitting: Internal Medicine

## 2020-05-01 ENCOUNTER — Other Ambulatory Visit: Payer: Self-pay

## 2020-05-01 VITALS — BP 132/82 | HR 74 | Ht 63.0 in | Wt 168.0 lb

## 2020-05-01 DIAGNOSIS — Z79899 Other long term (current) drug therapy: Secondary | ICD-10-CM | POA: Diagnosis not present

## 2020-05-01 DIAGNOSIS — I10 Essential (primary) hypertension: Secondary | ICD-10-CM

## 2020-05-01 DIAGNOSIS — E785 Hyperlipidemia, unspecified: Secondary | ICD-10-CM | POA: Diagnosis not present

## 2020-05-01 DIAGNOSIS — T466X5A Adverse effect of antihyperlipidemic and antiarteriosclerotic drugs, initial encounter: Secondary | ICD-10-CM

## 2020-05-01 DIAGNOSIS — M791 Myalgia, unspecified site: Secondary | ICD-10-CM | POA: Diagnosis not present

## 2020-05-01 DIAGNOSIS — E119 Type 2 diabetes mellitus without complications: Secondary | ICD-10-CM

## 2020-05-01 MED ORDER — ICOSAPENT ETHYL 1 G PO CAPS: 2.0000 g | ORAL_CAPSULE | Freq: Two times a day (BID) | ORAL | 3 refills | Status: DC

## 2020-05-01 NOTE — Patient Instructions (Signed)
Medication Instructions:  Start Vascepa 2 g twice a day  *If you need a refill on your cardiac medications before your next appointment, please call your pharmacy*   Lab Work: Your physician recommends that you return for lab work in 3 months ( Fasting Lipid)  If you have labs (blood work) drawn today and your tests are completely normal, you will receive your results only by: Marland Kitchen MyChart Message (if you have MyChart) OR . A paper copy in the mail If you have any lab test that is abnormal or we need to change your treatment, we will call you to review the results.   Testing/Procedures: None ordered    Follow-Up: At [facility], you and your health needs are our priority.  As part of our continuing mission to provide you with exceptional heart care, we have created designated Provider Care Teams.  These Care Teams include your primary Cardiologist (physician) and Advanced Practice Providers (APPs -  Physician Assistants and Nurse Practitioners) who all work together to provide you with the care you need, when you need it.  We recommend signing up for the patient portal called "MyChart".  Sign up information is provided on this After Visit Summary.  MyChart is used to connect with patients for Virtual Visits (Telemedicine).  Patients are able to view lab/test results, encounter notes, upcoming appointments, etc.  Non-urgent messages can be sent to your provider as well.   To learn more about what you can do with MyChart, go to [email or web address].     Your next appointment:   3 month(s)  The format for your next appointment:   In Person  Provider:   K. Mali Hilty, MD

## 2020-05-01 NOTE — Progress Notes (Signed)
Lipid Clinic Consult Note  Chief Complaint:  Manage dyslipidemia  Primary Care Physician: Vernie Shanks, MD  HPI:  Shannon Mora is a 59 y.o. female who is being seen today for the evaluation of dyslipidemia at the request of Vernie Shanks, MD.  This is a pleasant 59 year old female with type 2 diabetes followed by Dr. Cruzita Lederer in endocrinology.  She has been noted to have difficult to control dyslipidemia with primarily elevated triglycerides as well as high LDL.  Her last direct LDL 10 days ago was 166.  Total cholesterol was 233, triglycerides 378 and HDL of 38.5.  Non-HDL cholesterol was 194 (approximately 30 points higher as expected then LDL cholesterol, suggesting concordance).  Unfortunately, Shannon Mora has had difficulty with medications.  She has had side effects with multiple statin medications including atorvastatin, rosuvastatin, simvastatin, ezetimibe, and alternatives such as the fibrates.  Her A1c is well controlled at 6.4.  She is also not an alcohol user.  There is family history of early onset heart disease in her father.  Really she is asymptomatic, denying any chest pain, worsening shortness of breath or associated symptoms.  Her statin side effects and not only included myalgias but significant back pain and joint stiffness.  She also had symptoms with fenofibrate and cholestyramine.  She did get some improvement in her LDL cholesterol on rosuvastatin, but again it was not tolerated.  02/21/2019  Shannon Mora is seen here for follow-up.  She had a marked dyslipidemia as above and underwent CT coronary calcium scoring.  Fortunately showed 0 coronary calcium score.  This is associated with low risk of events.  We had recommended pursuing PCSK9 inhibitor therapy given her intolerance to statins however this was declined due to lack of ASCVD.  Based on this, elected to pursue therapy with a newly available Nexletol.  She has been taking this now for a month and seems to be  tolerating it well.  Although it is only moderate potency, this should be associated with some improvement in her symptoms.  Optionally, we may be able to add ezetimibe to this therapy.  She apparently had had side effects to this however it may have been in combination with simvastatin and the statin is certainly more likely to cause her side effects.  Also today, she noted that blood pressures been elevated.  Although we have been seeing her for dyslipidemia, her PCP requested that we evaluated her for hypertension.  I reviewed blood pressures over the last couple of years which showed generally blood pressures between 468-032 systolic.  As a diabetic, she would benefit for them renal protection with ACE or ARB.  10/31/2019  Shannon Mora returns today for follow-up.  She has persistently elevated triglycerides now 273.  Total cholesterol 177, HDL 36 and LDL at target at 95.  This is based on a 0 calcium score therefore I felt that we did not need to be quite as aggressive with her risk modification.  Her diabetes has been very well controlled.  She sees endocrinology and her A1c was somewhere around 6.2.  We talked about options for lowering her triglycerides, including the fibrate and or Vascepa.  There is little evidence for risk reduction with a fibrate however as diabetic with at least 2 risk factors, she could qualify for Vascepa.  Despite this, she is not interested in additional medications at this time.  She wants to work on more physical activity and weight loss.   05/01/2020  Shannon Mora is  seen today in follow-up.  Its been now 36-month since we last saw each other.  She was planning to work on diet and make other adjustments in her lifestyle and activities.  Unfortunately her lipids are not much better.  LDL well it was at target of 95 is now 150, total cholesterol 233, HDL 36 and triglycerides 284.  She is compliant with the Nexletol.  She said diet has not been as ideal and her exercise levels are  less.  PMHx:  Past Medical History:  Diagnosis Date  . Basal cell cancer    On forehead.  Marland Kitchen GERD (gastroesophageal reflux disease)   . HSV-1 (herpes simplex virus 1) infection   . Hyperglycemia   . Hyperlipidemia   . Perimenopausal   . Vitamin D deficiency      FAMHx:  Family History  Problem Relation Age of Onset  . Heart disease Father     SOCHx:   reports that she has never smoked. She has never used smokeless tobacco. She reports current alcohol use of about 1.0 standard drink of alcohol per week. She reports that she does not use drugs.  ALLERGIES:  Allergies  Allergen Reactions  . Statins Other (See Comments)    Atorvastatin, rosuvastatin, simvastatin, ezetimibe    ROS: Pertinent items noted in HPI and remainder of comprehensive ROS otherwise negative.  HOME MEDS: Current Outpatient Medications on File Prior to Visit  Medication Sig Dispense Refill  . Cholecalciferol (VITAMIN D3) 2000 units TABS Take by mouth.    . Coenzyme Q10 (CO Q10) 200 MG CAPS Co Q-10 200 mg capsule  Take by oral route.    . fluorouracil (EFUDEX) 5 % cream Apply topically daily.    Marland Kitchen glucose blood (FREESTYLE LITE) test strip Use to check blood sugars one time daily 100 each 12  . Lancets (FREESTYLE) lancets Use to check blood sugars one time daily 100 each 12  . Magnesium 500 MG TABS Take 1 tablet by mouth every morning.    . meloxicam (MOBIC) 15 MG tablet Take 15 mg by mouth 3 (three) times daily as needed.    . metFORMIN (GLUCOPHAGE) 500 MG tablet Take 1 tablet (500 mg total) by mouth daily. 90 tablet 4  . Misc Natural Products (IMMUNE FORMULA PO) Take 2 tablets by mouth every morning. Immuneti    . Multiple Vitamins-Minerals (EYE VITAMINS PO) Take 1 capsule by mouth daily.      Marland Kitchen NEXLETOL 180 MG TABS TAKE 1 TABLET DAILY 90 tablet 3  . Omega-3 Fatty Acids (FISH OIL PO) Take 2 capsules by mouth every morning.    Marland Kitchen telmisartan (MICARDIS) 40 MG tablet TAKE 1 TABLET DAILY 90 tablet 3  .  hydrochlorothiazide (MICROZIDE) 12.5 MG capsule Take 1 capsule (12.5 mg total) by mouth daily. 90 capsule 3   No current facility-administered medications on file prior to visit.    LABS/IMAGING: No results found for this or any previous visit (from the past 48 hour(s)). No results found.  LIPID PANEL:    Component Value Date/Time   CHOL 233 (H) 04/24/2020 0949   TRIG 284 (H) 04/24/2020 0949   TRIG 246 (H) 12/02/2013 1239   HDL 36 (L) 04/24/2020 0949   HDL 45 12/02/2013 1239   CHOLHDL 6.5 (H) 04/24/2020 0949   CHOLHDL 6 06/07/2018 0819   VLDL 75.6 (H) 06/07/2018 0819   LDLCALC 145 (H) 04/24/2020 0949   LDLCALC 138 (H) 12/02/2013 1239   LDLDIRECT 150 (H) 04/24/2020 3532  LDLDIRECT 166.0 06/07/2018 0819    WEIGHTS: Wt Readings from Last 3 Encounters:  05/01/20 168 lb (76.2 kg)  10/31/19 165 lb (74.8 kg)  05/03/19 160 lb 6.4 oz (72.8 kg)    VITALS: BP 132/82   Pulse 74   Ht 5\' 3"  (1.6 m)   Wt 168 lb (76.2 kg)   LMP 10/07/2013   BMI 29.76 kg/m   EXAM: Deferred  EKG: Deferred  ASSESSMENT: 1. Mixed dyslipidemia (high LDL, triglycerides and low HDL) 2. Type 2 diabetes 3. Family history of premature coronary disease - Zero (0) CAC 4. Statin intolerance - myalgias 5. Essential hypertension  PLAN: 1.   Shannon Mora has had increase in her LDL and triglycerides despite having time to work on her diet and activity levels.  Her A1c is actually well controlled with top blood sugars in the 110's.  Fortunately she had no coronary calcium, however she has diabetes and to cardiovascular risk factors including sedentary lifestyle and hypertension.  There is also strong family history of early heart disease.  Her persistently elevated triglycerides are also a risk factor.  She cannot take statins or ezetimibe.  She is not a candidate for PCSK9 inhibitor given her lack of coronary disease.  I think she would however benefit from Vascepa based on trial data from [employer] which  demonstrated cardiovascular risk reduction in patients with diabetes and to cardiovascular risk factors that had triglycerides between 150 and 500.  We will go ahead and work for prior authorization of this.  I did provide a free 30-day trial card for her today.  Follow-up with me in 3 months.  Pixie Casino, MD, [facility], [place] Director of the Advanced Lipid Disorders &  Cardiovascular Risk Reduction Clinic Diplomate of the American Board of Clinical Lipidology Attending Cardiologist  Direct Dial: [phone]  Fax: [phone]  Website:  [email or web address] 05/01/2020, 9:32 AM

## 2020-05-10 NOTE — Telephone Encounter (Signed)
vasecpa PA form faxed to [employer] @ [phone]

## 2020-05-15 NOTE — Telephone Encounter (Signed)
Vascepa prior authorization denied by [employer]  Per fax, coverage is provided in situation where the patient is currently receiving a statin with LDL less than 100mg /dL  Routed to MD

## 2020-05-31 MED ORDER — ICOSAPENT ETHYL 1 G PO CAPS: 2.0000 g | ORAL_CAPSULE | Freq: Two times a day (BID) | ORAL | 3 refills | Status: DC

## 2020-05-31 NOTE — Telephone Encounter (Signed)
Pixie Casino, MD  You 3 minutes ago (12:07 PM)   She could try the generic - it does not have CAD risk reduction approval, but she had no coronary calcium on her scan, so that is reassuring. Will certainly lower TG's.   Dr Lemmie Evens

## 2020-07-28 LAB — LIPID PANEL
Chol/HDL Ratio: 5.4 ratio — ABNORMAL HIGH (ref 0.0–4.4)
Cholesterol, Total: 210 mg/dL — ABNORMAL HIGH (ref 100–199)
HDL: 39 mg/dL — ABNORMAL LOW (ref >39)
LDL Chol Calc (NIH): 134 mg/dL — ABNORMAL HIGH (ref 0–99)
Triglycerides: 206 mg/dL — ABNORMAL HIGH (ref 0–149)
VLDL Cholesterol Cal: 37 mg/dL (ref 5–40)

## 2020-08-01 ENCOUNTER — Ambulatory Visit: Admitting: Internal Medicine

## 2020-08-02 ENCOUNTER — Ambulatory Visit: Admitting: Internal Medicine

## 2020-08-02 ENCOUNTER — Other Ambulatory Visit: Payer: Self-pay

## 2020-08-02 ENCOUNTER — Encounter: Payer: Self-pay | Admitting: Internal Medicine

## 2020-08-02 VITALS — BP 134/76 | HR 68 | Ht 63.0 in | Wt 161.0 lb

## 2020-08-02 DIAGNOSIS — T466X5A Adverse effect of antihyperlipidemic and antiarteriosclerotic drugs, initial encounter: Secondary | ICD-10-CM

## 2020-08-02 DIAGNOSIS — E785 Hyperlipidemia, unspecified: Secondary | ICD-10-CM | POA: Diagnosis not present

## 2020-08-02 DIAGNOSIS — M791 Myalgia, unspecified site: Secondary | ICD-10-CM | POA: Diagnosis not present

## 2020-08-02 DIAGNOSIS — E119 Type 2 diabetes mellitus without complications: Secondary | ICD-10-CM | POA: Diagnosis not present

## 2020-08-02 MED ORDER — PRAVASTATIN SODIUM 20 MG PO TABS: 20.0000 mg | ORAL_TABLET | Freq: Every evening | ORAL | 5 refills | Status: DC

## 2020-08-02 NOTE — Progress Notes (Signed)
Lipid Clinic Consult Note  Chief Complaint:  Manage dyslipidemia  Primary Care Physician: Vernie Shanks, MD  HPI:  Shannon Mora is a 59 y.o. female who is being seen today for the evaluation of dyslipidemia at the request of Vernie Shanks, MD.  This is a pleasant 59 year old female with type 2 diabetes followed by Dr. Cruzita Lederer in endocrinology.  She has been noted to have difficult to control dyslipidemia with primarily elevated triglycerides as well as high LDL.  Her last direct LDL 10 days ago was 166.  Total cholesterol was 233, triglycerides 378 and HDL of 38.5.  Non-HDL cholesterol was 194 (approximately 30 points higher as expected then LDL cholesterol, suggesting concordance).  Unfortunately, [name] has had difficulty with medications.  She has had side effects with multiple statin medications including atorvastatin, rosuvastatin, simvastatin, ezetimibe, and alternatives such as the fibrates.  Her A1c is well controlled at 6.4.  She is also not an alcohol user.  There is family history of early onset heart disease in her father.  Really she is asymptomatic, denying any chest pain, worsening shortness of breath or associated symptoms.  Her statin side effects and not only included myalgias but significant back pain and joint stiffness.  She also had symptoms with fenofibrate and cholestyramine.  She did get some improvement in her LDL cholesterol on rosuvastatin, but again it was not tolerated.  02/21/2019  [name] is seen here for follow-up.  She had a marked dyslipidemia as above and underwent CT coronary calcium scoring.  Fortunately showed 0 coronary calcium score.  This is associated with low risk of events.  We had recommended pursuing PCSK9 inhibitor therapy given her intolerance to statins however this was declined due to lack of ASCVD.  Based on this, elected to pursue therapy with a newly available Nexletol.  She has been taking this now for a month and seems to be  tolerating it well.  Although it is only moderate potency, this should be associated with some improvement in her symptoms.  Optionally, we may be able to add ezetimibe to this therapy.  She apparently had had side effects to this however it may have been in combination with simvastatin and the statin is certainly more likely to cause her side effects.  Also today, she noted that blood pressures been elevated.  Although we have been seeing her for dyslipidemia, her PCP requested that we evaluated her for hypertension.  I reviewed blood pressures over the last couple of years which showed generally blood pressures between 468-032 systolic.  As a diabetic, she would benefit for them renal protection with ACE or ARB.  10/31/2019  [name] returns today for follow-up.  She has persistently elevated triglycerides now 273.  Total cholesterol 177, HDL 36 and LDL at target at 95.  This is based on a 0 calcium score therefore I felt that we did not need to be quite as aggressive with her risk modification.  Her diabetes has been very well controlled.  She sees endocrinology and her A1c was somewhere around 6.2.  We talked about options for lowering her triglycerides, including the fibrate and or Vascepa.  There is little evidence for risk reduction with a fibrate however as diabetic with at least 2 risk factors, she could qualify for Vascepa.  Despite this, she is not interested in additional medications at this time.  She wants to work on more physical activity and weight loss.   05/01/2020  [name] is  seen today in follow-up.  Its been now 43-month since we last saw each other.  She was planning to work on diet and make other adjustments in her lifestyle and activities.  Unfortunately her lipids are not much better.  LDL well it was at target of 95 is now 150, total cholesterol 233, HDL 36 and triglycerides 284.  She is compliant with the Nexletol.  She said diet has not been as ideal and her exercise levels are  less.  08/02/2020  [name] is seen today for follow-up.  She reports be more active and is now working part-time job.  She has made some dietary changes and is using generic icosapent ethyl in addition to her bempedoic acid.  The combination of medications has helped her cholesterol with improvement in her triglycerides and a small improvement in LDL.  Her total cholesterol has come down to 210 and LDL is 134.  Triglycerides are still elevated 206 but improved from previous at 284.  PMHx:  Past Medical History:  Diagnosis Date  . Basal cell cancer    On forehead.  Marland Kitchen GERD (gastroesophageal reflux disease)   . HSV-1 (herpes simplex virus 1) infection   . Hyperglycemia   . Hyperlipidemia   . Perimenopausal   . Vitamin D deficiency      FAMHx:  Family History  Problem Relation Age of Onset  . Heart disease Father     SOCHx:   reports that she has never smoked. She has never used smokeless tobacco. She reports current alcohol use of about 1.0 standard drink of alcohol per week. She reports that she does not use drugs.  ALLERGIES:  Allergies  Allergen Reactions  . Statins Other (See Comments)    Atorvastatin, rosuvastatin, simvastatin, ezetimibe    ROS: Pertinent items noted in HPI and remainder of comprehensive ROS otherwise negative.  HOME MEDS: Current Outpatient Medications on File Prior to Visit  Medication Sig Dispense Refill  . Cholecalciferol (VITAMIN D3) 2000 units TABS Take by mouth.    . Coenzyme Q10 (CO Q10) 200 MG CAPS Co Q-10 200 mg capsule  Take by oral route.    . fluorouracil (EFUDEX) 5 % cream Apply topically daily.    Marland Kitchen glucose blood (FREESTYLE LITE) test strip Use to check blood sugars one time daily 100 each 12  . hydrochlorothiazide (MICROZIDE) 12.5 MG capsule Take 1 capsule (12.5 mg total) by mouth daily. 90 capsule 3  . icosapent Ethyl (VASCEPA) 1 g capsule Take 2 capsules (2 g total) by mouth 2 (two) times daily. 360 capsule 3  . Lancets  (FREESTYLE) lancets Use to check blood sugars one time daily 100 each 12  . Magnesium 500 MG TABS Take 1 tablet by mouth every morning.    . meloxicam (MOBIC) 15 MG tablet Take 15 mg by mouth 3 (three) times daily as needed.    . metFORMIN (GLUCOPHAGE) 500 MG tablet Take 1 tablet (500 mg total) by mouth daily. 90 tablet 4  . Misc Natural Products (IMMUNE FORMULA PO) Take 2 tablets by mouth every morning. Immuneti    . Multiple Vitamins-Minerals (EYE VITAMINS PO) Take 1 capsule by mouth daily.    Marland Kitchen NEXLETOL 180 MG TABS TAKE 1 TABLET DAILY 90 tablet 3  . Omega-3 Fatty Acids (FISH OIL PO) Take 2 capsules by mouth every morning.    Marland Kitchen telmisartan (MICARDIS) 40 MG tablet TAKE 1 TABLET DAILY 90 tablet 3   No current facility-administered medications on file prior to visit.  LABS/IMAGING: No results found for this or any previous visit (from the past 48 hour(s)). No results found.  LIPID PANEL:    Component Value Date/Time   CHOL 210 (H) 07/27/2020 0820   TRIG 206 (H) 07/27/2020 0820   TRIG 246 (H) 12/02/2013 1239   HDL 39 (L) 07/27/2020 0820   HDL 45 12/02/2013 1239   CHOLHDL 5.4 (H) 07/27/2020 0820   CHOLHDL 6 06/07/2018 0819   VLDL 75.6 (H) 06/07/2018 0819   LDLCALC 134 (H) 07/27/2020 0820   LDLCALC 138 (H) 12/02/2013 1239   LDLDIRECT 150 (H) 04/24/2020 0949   LDLDIRECT 166.0 06/07/2018 0819    WEIGHTS: Wt Readings from Last 3 Encounters:  08/02/20 161 lb (73 kg)  05/01/20 168 lb (76.2 kg)  10/31/19 165 lb (74.8 kg)    VITALS: BP 134/76   Pulse 68   Ht 5\' 3"  (1.6 m)   Wt 161 lb (73 kg)   LMP 10/07/2013   BMI 28.52 kg/m   EXAM: Deferred  EKG: Deferred  ASSESSMENT: 1. Mixed dyslipidemia (high LDL, triglycerides and low HDL), goal LDL <100 2. Type 2 diabetes 3. Family history of premature coronary disease - Zero (0) CAC 4. Statin intolerance - myalgias 5. Essential hypertension  PLAN: 1.   Ms. Kunde has had some interval improvement in her lipids however  they were much better controlled when she was on a statin.  I would like to revisit that.  Although she has failed several other statins she would be willing to try a lower potency statin and I would recommend pravastatin 20 mg daily.  Hopefully this will be tolerated in addition to her medications and provide the benefit to try to target her LDL to less than 100.  Follow-up with me and repeat lipids in 3 months.  Pixie Casino, MD, [facility], [place] Director of the Advanced Lipid Disorders &  Cardiovascular Risk Reduction Clinic Diplomate of the American Board of Clinical Lipidology Attending Cardiologist  Direct Dial: [phone]  Fax: [phone]  Website:  [email or web address] 08/02/2020, 8:41 AM

## 2020-08-02 NOTE — Patient Instructions (Signed)
Medication Instructions:  START pravastatin 20mg  daily CONTINUE all other current medications  *If you need a refill on your cardiac medications before your next appointment, please call your pharmacy*   Lab Work: FASTING lipid panel in 3 months  If you have labs (blood work) drawn today and your tests are completely normal, you will receive your results only by: Marland Kitchen MyChart Message (if you have MyChart) OR . A paper copy in the mail If you have any lab test that is abnormal or we need to change your treatment, we will call you to review the results.   Testing/Procedures: NONE   Follow-Up: At [facility], you and your health needs are our priority.  As part of our continuing mission to provide you with exceptional heart care, we have created designated Provider Care Teams.  These Care Teams include your primary Cardiologist (physician) and Advanced Practice Providers (APPs -  Physician Assistants and Nurse Practitioners) who all work together to provide you with the care you need, when you need it.  We recommend signing up for the patient portal called "MyChart".  Sign up information is provided on this After Visit Summary.  MyChart is used to connect with patients for Virtual Visits (Telemedicine).  Patients are able to view lab/test results, encounter notes, upcoming appointments, etc.  Non-urgent messages can be sent to your provider as well.   To learn more about what you can do with MyChart, go to [email or web address].    Your next appointment:   3 month(s) - lipid clinic  The format for your next appointment:   In Person  Provider:   K. Mali Hilty, MD   Other Instructions

## 2020-08-09 ENCOUNTER — Other Ambulatory Visit: Payer: Self-pay

## 2020-08-09 ENCOUNTER — Ambulatory Visit (INDEPENDENT_AMBULATORY_CARE_PROVIDER_SITE_OTHER): Admitting: Internal Medicine

## 2020-08-09 ENCOUNTER — Encounter: Payer: Self-pay | Admitting: Internal Medicine

## 2020-08-09 VITALS — BP 130/82 | HR 79 | Ht 63.0 in | Wt 164.2 lb

## 2020-08-09 DIAGNOSIS — E119 Type 2 diabetes mellitus without complications: Secondary | ICD-10-CM

## 2020-08-09 DIAGNOSIS — E663 Overweight: Secondary | ICD-10-CM | POA: Diagnosis not present

## 2020-08-09 DIAGNOSIS — E785 Hyperlipidemia, unspecified: Secondary | ICD-10-CM | POA: Diagnosis not present

## 2020-08-09 LAB — COMPREHENSIVE METABOLIC PANEL
ALT: 14 U/L (ref 0–35)
AST: 16 U/L (ref 0–37)
Albumin: 4.4 g/dL (ref 3.5–5.2)
Alkaline Phosphatase: 66 U/L (ref 39–117)
BUN: 22 mg/dL (ref 6–23)
CO2: 30 mEq/L (ref 19–32)
Calcium: 10.2 mg/dL (ref 8.4–10.5)
Chloride: 102 mEq/L (ref 96–112)
Creatinine, Ser: 0.72 mg/dL (ref 0.40–1.20)
GFR: 92.1 mL/min (ref >60.00)
Glucose, Bld: 163 mg/dL — ABNORMAL HIGH (ref 70–99)
Potassium: 3.9 mEq/L (ref 3.5–5.1)
Sodium: 139 mEq/L (ref 135–145)
Total Bilirubin: 0.3 mg/dL (ref 0.2–1.2)
Total Protein: 7.3 g/dL (ref 6.0–8.3)

## 2020-08-09 LAB — MICROALBUMIN / CREATININE URINE RATIO
Creatinine,U: 47.2 mg/dL
Microalb Creat Ratio: 1.5 mg/g (ref 0.0–30.0)
Microalb, Ur: <0.7 mg/dL (ref 0.0–1.9)

## 2020-08-09 LAB — POCT GLYCOSYLATED HEMOGLOBIN (HGB A1C): Hemoglobin A1C: 6.7 % — AB (ref 4.0–5.6)

## 2020-08-09 MED ORDER — FREESTYLE LITE TEST VI STRP: ORAL_STRIP | 4 refills | Status: DC

## 2020-08-09 MED ORDER — FREESTYLE LANCETS MISC: 4 refills | Status: DC

## 2020-08-09 NOTE — Progress Notes (Signed)
Patient ID: Shannon Mora, female   DOB: 11/29/1960, 59 y.o.   MRN: [id number]  This visit occurred during the SARS-CoV-2 public health emergency.  Safety protocols were in place, including screening questions prior to the visit, additional usage of staff PPE, and extensive cleaning of exam room while observing appropriate contact time as indicated for disinfecting solutions.   HPI: Shannon Mora is a 59 y.o.-year-old female, returning for follow-up for DM2, dx in 2015, prev. GDM 1989, 1992, non-insulin-dependent, controlled, without long-term complications and also hyperlipidemia.  Last visit 6 months ago.  She was on a mostly plant-based diet at last visit, but she stopped this and she is now on a low carb diet.  Sugars increased.  She had 2 steroid inj since last OV.  DM2: Reviewed HbA1c levels: Lab Results  Component Value Date   HGBA1C 6.2 (A) 04/13/2019   HGBA1C 6.4 (A) 06/07/2018   HGBA1C 6.6 (A) 01/28/2018  07/07/2017: HbA1c 6.8% 03/03/2017: HbA1c 6.5% 10/30/2016: HbA1c 6.6%  Patient is on: - Metformin 500 mg in a.m. >> at night  She is checking her sugars once a day: - am: n/c >> 89-127 >> 78-81 >> 84-113 >> 88-99, 114 >> 116-130 - 2h after b'fast: n/c >> 130 >> 101 >> n/c >> 120 - before lunch: 83-109 >> n/c >> 89-93 >> 83, 87-103 >> 103 - 2h after lunch: n/c >> 124 >> 114 >> n/c - before dinner: 95 >> n/c >> 92-101, 113 >> 88-99 >> 113, 115 - 2h after dinner: n/c >> 180 >> 122 >> n/c - bedtime: n/c >> 146 >> n/c - nighttime: n/c Lowest sugar was 78 >> 84 >> 83 >> 103; it is unclear at which level she has hypoglycemia awareness. Highest sugar was 180 >> 146 >> 114 >> 130.  Glucometer: AccuCheck  Pt's meals are: - Breakfast: egg white, vegan links, coffee - Lunch: protein shake >> now salad + [place] breast (1/4 cup) - Dinner: vegetarian dish or fish once a week, occasional poultry >> veggies - no carbs - Snacks: 1-2  -No CKD, last BUN/creatinine:  Lab Results   Component Value Date   BUN 15 06/07/2018   BUN 7 12/02/2013   CREATININE 0.74 06/07/2018   CREATININE 0.63 12/02/2013  07/07/2017: Glucose 108, BUN/creatinine 14/0.75 She is on telmisartan. - last eye exam was on 02/07/2020: No DR reportedly, + macular degeneration (pseudovitelliform dystrophy). Dr. Lady Gary and Dr. Zigmund Daniel. -No numbness and tingling in her feet.  Pt has FH of DM in brother.  Hyperlipidemia: -+ Familial hyperlipidemia  Reviewed latest lipid panel: Lab Results  Component Value Date   CHOL 210 (H) 07/27/2020   CHOL 233 (H) 04/24/2020   CHOL 177 10/28/2019   Lab Results  Component Value Date   HDL 39 (L) 07/27/2020   HDL 36 (L) 04/24/2020   HDL 36 (L) 10/28/2019   Lab Results  Component Value Date   LDLCALC 134 (H) 07/27/2020   LDLCALC 145 (H) 04/24/2020   LDLCALC 95 10/28/2019   Lab Results  Component Value Date   TRIG 206 (H) 07/27/2020   TRIG 284 (H) 04/24/2020   TRIG 273 (H) 10/28/2019   Lab Results  Component Value Date   CHOLHDL 5.4 (H) 07/27/2020   CHOLHDL 6.5 (H) 04/24/2020   CHOLHDL 4.9 (H) 10/28/2019   Lab Results  Component Value Date   LDLDIRECT 150 (H) 04/24/2020   LDLDIRECT 105 (H) 10/28/2019   LDLDIRECT 104 (H) 08/24/2019   07/07/2017: 264/167/43/187 03/03/2017: 239/158/43/165  10/30/2016: 128/149/47/51 06/30/2016: 237/308/41/134 We tried fluvastatin XL (after preauthorization).  She could not tolerate this due to muscle aches and joint aches even when she was taking it every 3 days. She was on ezetimibe >> mm pain She is intolerant to statins: tried Lipitor, low-dose Crestor >> mm pain.  However, Crestor worked great for her >> LDL decreased to 51. She retried this 2020 >> joint pain She tried Fenofibrate and also Cholestyramine >> did not work. I referred her to the lipid clinic-she was started on Bempedoic acid is a PCSK9 inhibitor was not covered by [employer]. She started on generic Vascepa 2g 2x a day, also on CoQ10  No  history of hypothyroidism: 10/30/2016: TSH 1.54 Lab Results  Component Value Date   TSH 1.770 08/08/2013   Vitamin D levels were normal: Lab Results  Component Value Date   VD25OH 32.8 12/02/2013   VD25OH 39.0 2013-08-08   Her father died in his 67s from heart disease.  She has multiple relatives on father side with hyperlipidemia and heart disease.  She was started on HCTZ by Dr. Debara Pickett.  ROS: Constitutional: no weight gain/+ weight loss, no fatigue, no subjective hyperthermia, no subjective hypothermia Eyes: no blurry vision, no xerophthalmia ENT: no sore throat, no nodules palpated in neck, no dysphagia, no odynophagia, no hoarseness Cardiovascular: no CP/no SOB/no palpitations/no leg swelling Respiratory: no cough/no SOB/no wheezing Gastrointestinal: no N/no V/no D/no C/no acid reflux Musculoskeletal: no muscle aches/no joint aches Skin: no rashes, no hair loss Neurological: no tremors/no numbness/no tingling/no dizziness  I reviewed pt's medications, allergies, PMH, social hx, family hx, and changes were documented in the history of present illness. Otherwise, unchanged from my initial visit note.  Surgeries: - tubal ligation sx 1992 - gall bladder sx - low lumbar sx  Past Medical History:  Diagnosis Date  . Basal cell cancer    On forehead.  Marland Kitchen GERD (gastroesophageal reflux disease)   . HSV-1 (herpes simplex virus 1) infection   . Hyperglycemia   . Hyperlipidemia   . Perimenopausal   . Vitamin D deficiency    Social History   Socioeconomic History  . Marital status: Married    Spouse name: Not on file  . Number of children: 2  Social Needs  Occupational History  . Not on file  Tobacco Use  . Smoking status: Never Smoker  . Smokeless tobacco: Never Used  Substance and Sexual Activity  . Alcohol use: Yes    Alcohol/week: 0.6 oz    Types: 1 Glasses of hard cider per mo    Comment: monthly  . Drug use: No   Current Outpatient Medications on File Prior to  Visit  Medication Sig Dispense Refill  . Cholecalciferol (VITAMIN D3) 2000 units TABS Take by mouth.    . Coenzyme Q10 (CO Q10) 200 MG CAPS Co Q-10 200 mg capsule  Take by oral route.    . fluorouracil (EFUDEX) 5 % cream Apply topically daily.    Marland Kitchen glucose blood (FREESTYLE LITE) test strip Use to check blood sugars one time daily 100 each 12  . hydrochlorothiazide (MICROZIDE) 12.5 MG capsule Take 1 capsule (12.5 mg total) by mouth daily. 90 capsule 3  . icosapent Ethyl (VASCEPA) 1 g capsule Take 2 capsules (2 g total) by mouth 2 (two) times daily. 360 capsule 3  . Lancets (FREESTYLE) lancets Use to check blood sugars one time daily 100 each 12  . Magnesium 500 MG TABS Take 1 tablet by mouth every morning.    Marland Kitchen  meloxicam (MOBIC) 15 MG tablet Take 15 mg by mouth 3 (three) times daily as needed.    . metFORMIN (GLUCOPHAGE) 500 MG tablet Take 1 tablet (500 mg total) by mouth daily. 90 tablet 4  . Misc Natural Products (IMMUNE FORMULA PO) Take 2 tablets by mouth every morning. Immuneti    . Multiple Vitamins-Minerals (EYE VITAMINS PO) Take 1 capsule by mouth daily.    Marland Kitchen NEXLETOL 180 MG TABS TAKE 1 TABLET DAILY 90 tablet 3  . Omega-3 Fatty Acids (FISH OIL PO) Take 2 capsules by mouth every morning.    . pravastatin (PRAVACHOL) 20 MG tablet Take 1 tablet (20 mg total) by mouth every evening. 30 tablet 5  . telmisartan (MICARDIS) 40 MG tablet TAKE 1 TABLET DAILY 90 tablet 3   No current facility-administered medications on file prior to visit.   Allergies  Allergen Reactions  . Statins Other (See Comments)    Atorvastatin, rosuvastatin, simvastatin, ezetimibe   Family History  Problem Relation Age of Onset  . Heart disease Father     PE: BP 130/82   Pulse 79   Ht 5\' 3"  (1.6 m)   Wt 164 lb 3.2 oz (74.5 kg)   LMP 10/07/2013   SpO2 96%   BMI 29.09 kg/m  Wt Readings from Last 3 Encounters:  08/09/20 164 lb 3.2 oz (74.5 kg)  08/02/20 161 lb (73 kg)  05/01/20 168 lb (76.2 kg)    Constitutional: Slightly overweight, in NAD Eyes: PERRLA, EOMI, no exophthalmos ENT: moist mucous membranes, no thyromegaly, no cervical lymphadenopathy Cardiovascular: RRR, No MRG Respiratory: CTA B Gastrointestinal: abdomen soft, NT, ND, BS+ Musculoskeletal: no deformities, strength intact in all 4 Skin: moist, warm, no rashes Neurological: no tremor with outstretched hands, DTR normal in all 4  ASSESSMENT: 1. DM2, non-insulin-dependent, controlled, without long term complications  2. HL  3.  Overweight  PLAN:  1. Patient with controlled diabetes type 2, on minimal Metformin dose.  Her sugars improved after following a mostly plant-based diet, but since last visit, she switched to a low carb diet.  Latest HbA1c was at goal, at 6.2%, but this was checked in 03/2019, more than a year ago. -At this visit, sugars are higher, although still at goal in the morning and she is rarely checking later in the day but whenever she checks these are at goal.  However, she is interested in lowering her blood sugars even more and we discussed about going back to the plant-based diet.  She agrees with this plan. -No need to change her regimen for now, but we can definitely increase the Metformin if needed at next visit  - I suggested to:  Patient Instructions  Please continue Metformin 500 mg daily.  Please return in 6 months with your sugar log.   - we checked her HbA1c: 6.7% (higher) - advised to check sugars at different times of the day - 1x a day, rotating check times - advised for yearly eye exams >> she is UTD - will check a CMP and Urinary ACR today - return to clinic in 6 months  2. HL -Patient with familial hyperlipidemia, with history of early heart disease in her father, in his early 40s. -She has previous intolerances to Lipitor and Crestor (muscle cramps), even at low doses, despite improvement in her lipid panels.  We also tried fluvastatin XL but she could not tolerate  it. -She tried fenofibrate and bile acid sequestrants but this did not work for her. -Tried  Pravastatin this year >> in 1 week >> increased mm pain -I referred her to the lipid clinic and she was started on Bempedoic acid as [employer] refused to cover PCSK9 inhibitor for her.  She tolerates this well. -Reviewed latest lipid panel: LDL still above goal, but improved.  Triglycerides also improved: Lab Results  Component Value Date   CHOL 210 (H) 07/27/2020   HDL 39 (L) 07/27/2020   LDLCALC 134 (H) 07/27/2020   LDLDIRECT 150 (H) 04/24/2020   TRIG 206 (H) 07/27/2020   CHOLHDL 5.4 (H) 07/27/2020  -Discussed about switching back to a plant-based diet, which can help more with reducing the LDL.  3.  Overweight -In the past, she started on a modified plant-based diet and starting to lose weight, however, before last visit, she relaxed her diet and gained 4 pounds.  She was planning to return to the plant-based diet. -gained 5 lbs since last in person visit, but recent weight loss of 4 pounds   Component     Latest Ref Rng & Units 08/09/2020  Sodium     135 - 145 mEq/L 139  Potassium     3.5 - 5.1 mEq/L 3.9  Chloride     96 - 112 mEq/L 102  CO2     19 - 32 mEq/L 30  Glucose     70 - 99 mg/dL 163 (H)  BUN     6 - 23 mg/dL 22  Creatinine     0.40 - 1.20 mg/dL 0.72  Total Bilirubin     0.2 - 1.2 mg/dL 0.3  Alkaline Phosphatase     39 - 117 U/L 66  AST     0 - 37 U/L 16  ALT     0 - 35 U/L 14  Total Protein     6.0 - 8.3 g/dL 7.3  Albumin     3.5 - 5.2 g/dL 4.4  GFR     >60.00 mL/min 92.10  Calcium     8.4 - 10.5 mg/dL 10.2  Microalb, Ur     0.0 - 1.9 mg/dL <0.7  Creatinine,U     mg/dL 47.2  MICROALB/CREAT RATIO     0.0 - 30.0 mg/g 1.5  Labs are at goal with the exception of a high glucose.  Philemon Kingdom, MD PhD [facility]

## 2020-08-09 NOTE — Patient Instructions (Addendum)
Please continue Metformin 500 mg daily at night.  Restart the plant based diet.  Please return in 6 months with your sugar log.

## 2020-08-23 ENCOUNTER — Other Ambulatory Visit: Payer: Self-pay | Admitting: Internal Medicine

## 2020-08-23 DIAGNOSIS — I10 Essential (primary) hypertension: Secondary | ICD-10-CM

## 2020-10-24 ENCOUNTER — Ambulatory Visit: Admitting: Internal Medicine

## 2020-11-01 LAB — LIPID PANEL
Chol/HDL Ratio: 4 ratio (ref 0.0–4.4)
Cholesterol, Total: 143 mg/dL (ref 100–199)
HDL: 36 mg/dL — ABNORMAL LOW (ref >39)
LDL Chol Calc (NIH): 69 mg/dL (ref 0–99)
Triglycerides: 230 mg/dL — ABNORMAL HIGH (ref 0–149)
VLDL Cholesterol Cal: 38 mg/dL (ref 5–40)

## 2020-11-06 ENCOUNTER — Encounter: Payer: Self-pay | Admitting: Internal Medicine

## 2020-11-06 ENCOUNTER — Other Ambulatory Visit: Payer: Self-pay

## 2020-11-06 ENCOUNTER — Ambulatory Visit: Admitting: Internal Medicine

## 2020-11-06 VITALS — BP 135/65 | HR 70 | Ht 63.0 in | Wt 166.0 lb

## 2020-11-06 DIAGNOSIS — I10 Essential (primary) hypertension: Secondary | ICD-10-CM | POA: Diagnosis not present

## 2020-11-06 DIAGNOSIS — T466X5D Adverse effect of antihyperlipidemic and antiarteriosclerotic drugs, subsequent encounter: Secondary | ICD-10-CM

## 2020-11-06 DIAGNOSIS — E119 Type 2 diabetes mellitus without complications: Secondary | ICD-10-CM | POA: Diagnosis not present

## 2020-11-06 DIAGNOSIS — M791 Myalgia, unspecified site: Secondary | ICD-10-CM | POA: Diagnosis not present

## 2020-11-06 DIAGNOSIS — E785 Hyperlipidemia, unspecified: Secondary | ICD-10-CM | POA: Diagnosis not present

## 2020-11-06 DIAGNOSIS — T466X5A Adverse effect of antihyperlipidemic and antiarteriosclerotic drugs, initial encounter: Secondary | ICD-10-CM

## 2020-11-06 NOTE — Patient Instructions (Signed)
Medication Instructions:  Your physician recommends that you continue on your current medications as directed. Please refer to the Current Medication list given to you today.  -- we will try to send your Vascepa prescription to [facility] (mail order pharmacy) -- phone # [phone] *If you need a refill on your cardiac medications before your next appointment, please call your pharmacy*   Lab Work: FASTING lab work in 6 months  If you have labs (blood work) drawn today and your tests are completely normal, you will receive your results only by: Marland Kitchen MyChart Message (if you have MyChart) OR . A paper copy in the mail If you have any lab test that is abnormal or we need to change your treatment, we will call you to review the results.   Testing/Procedures: NONE   Follow-Up: At [facility], you and your health needs are our priority.  As part of our continuing mission to provide you with exceptional heart care, we have created designated Provider Care Teams.  These Care Teams include your primary Cardiologist (physician) and Advanced Practice Providers (APPs -  Physician Assistants and Nurse Practitioners) who all work together to provide you with the care you need, when you need it.  We recommend signing up for the patient portal called "MyChart".  Sign up information is provided on this After Visit Summary.  MyChart is used to connect with patients for Virtual Visits (Telemedicine).  Patients are able to view lab/test results, encounter notes, upcoming appointments, etc.  Non-urgent messages can be sent to your provider as well.   To learn more about what you can do with MyChart, go to [email or web address].    Your next appointment:   6 month(s) - lipid clinic  The format for your next appointment:   In Person  Provider:   K. Mali Hilty, MD   Other Instructions

## 2020-11-06 NOTE — Progress Notes (Addendum)
Lipid Clinic Consult Note  Chief Complaint:  Follow-up dyslipidemia  Primary Care Physician: Vernie Shanks, MD  HPI:  Shannon Mora is a 60 y.o. female who is being seen today for the evaluation of dyslipidemia at the request of Vernie Shanks, MD.  This is a pleasant 60 year old female with type 2 diabetes followed by Dr. Cruzita Lederer in endocrinology.  She has been noted to have difficult to control dyslipidemia with primarily elevated triglycerides as well as high LDL.  Her last direct LDL 10 days ago was 166.  Total cholesterol was 233, triglycerides 378 and HDL of 38.5.  Non-HDL cholesterol was 194 (approximately 30 points higher as expected then LDL cholesterol, suggesting concordance).  Unfortunately, Shannon Mora has had difficulty with medications.  She has had side effects with multiple statin medications including atorvastatin, rosuvastatin, simvastatin, ezetimibe, and alternatives such as the fibrates.  Her A1c is well controlled at 6.4.  She is also not an alcohol user.  There is family history of early onset heart disease in her father.  Really she is asymptomatic, denying any chest pain, worsening shortness of breath or associated symptoms.  Her statin side effects and not only included myalgias but significant back pain and joint stiffness.  She also had symptoms with fenofibrate and cholestyramine.  She did get some improvement in her LDL cholesterol on rosuvastatin, but again it was not tolerated.  02/21/2019  Shannon Mora is seen here for follow-up.  She had a marked dyslipidemia as above and underwent CT coronary calcium scoring.  Fortunately showed 0 coronary calcium score.  This is associated with low risk of events.  We had recommended pursuing PCSK9 inhibitor therapy given her intolerance to statins however this was declined due to lack of ASCVD.  Based on this, elected to pursue therapy with a newly available Nexletol.  She has been taking this now for a month and seems to be  tolerating it well.  Although it is only moderate potency, this should be associated with some improvement in her symptoms.  Optionally, we may be able to add ezetimibe to this therapy.  She apparently had had side effects to this however it may have been in combination with simvastatin and the statin is certainly more likely to cause her side effects.  Also today, she noted that blood pressures been elevated.  Although we have been seeing her for dyslipidemia, her PCP requested that we evaluated her for hypertension.  I reviewed blood pressures over the last couple of years which showed generally blood pressures between 073-710 systolic.  As a diabetic, she would benefit for them renal protection with ACE or ARB.  10/31/2019  Shannon Mora returns today for follow-up.  She has persistently elevated triglycerides now 273.  Total cholesterol 177, HDL 36 and LDL at target at 95.  This is based on a 0 calcium score therefore I felt that we did not need to be quite as aggressive with her risk modification.  Her diabetes has been very well controlled.  She sees endocrinology and her A1c was somewhere around 6.2.  We talked about options for lowering her triglycerides, including the fibrate and or Vascepa.  There is little evidence for risk reduction with a fibrate however as diabetic with at least 2 risk factors, she could qualify for Vascepa.  Despite this, she is not interested in additional medications at this time.  She wants to work on more physical activity and weight loss.   05/01/2020  Shannon Mora is  seen today in follow-up.  Its been now 54-month since we last saw each other.  She was planning to work on diet and make other adjustments in her lifestyle and activities.  Unfortunately her lipids are not much better.  LDL well it was at target of 95 is now 150, total cholesterol 233, HDL 36 and triglycerides 284.  She is compliant with the Nexletol.  She said diet has not been as ideal and her exercise levels are  less.  08/02/2020  Shannon Mora is seen today for follow-up.  She reports be more active and is now working part-time job.  She has made some dietary changes and is using generic icosapent ethyl in addition to her bempedoic acid.  The combination of medications has helped her cholesterol with improvement in her triglycerides and a small improvement in LDL.  Her total cholesterol has come down to 210 and LDL is 134.  Triglycerides are still elevated 206 but improved from previous at 284.  11/06/2020  Shannon Mora is seen today in follow-up.  Her lipids are remarkably better.  Her total cholesterol is now down to 143 from 210, triglycerides 230, HDL 36 and LDL 69 down from 134.  This is with the recent addition of pravastatin to her Nexletol.  She remains also on Vascepa.  Overall I think it is a good combination of medications.  Cost is the only other outstanding issue with these.  We may be able to get the cost of her Vascepa down.  PMHx:  Past Medical History:  Diagnosis Date  . Basal cell cancer    On forehead.  Marland Kitchen GERD (gastroesophageal reflux disease)   . HSV-1 (herpes simplex virus 1) infection   . Hyperglycemia   . Hyperlipidemia   . Perimenopausal   . Vitamin D deficiency      FAMHx:  Family History  Problem Relation Age of Onset  . Heart disease Father     SOCHx:   reports that she has never smoked. She has never used smokeless tobacco. She reports current alcohol use of about 1.0 standard drink of alcohol per week. She reports that she does not use drugs.  ALLERGIES:  Allergies  Allergen Reactions  . Statins Other (See Comments)    Atorvastatin, rosuvastatin, simvastatin, ezetimibe    ROS: Pertinent items noted in HPI and remainder of comprehensive ROS otherwise negative.  HOME MEDS: Current Outpatient Medications on File Prior to Visit  Medication Sig Dispense Refill  . Cholecalciferol (VITAMIN D3) 2000 units TABS Take by mouth.    Marland Kitchen glucose blood (FREESTYLE LITE)  test strip Use to check blood sugars one time daily 100 each 4  . hydrochlorothiazide (MICROZIDE) 12.5 MG capsule Take 1 capsule (12.5 mg total) by mouth daily. 90 capsule 3  . icosapent Ethyl (VASCEPA) 1 g capsule Take 2 capsules (2 g total) by mouth 2 (two) times daily. 360 capsule 3  . Lancets (FREESTYLE) lancets Use to check blood sugars one time daily 100 each 4  . Magnesium 500 MG TABS Take 1 tablet by mouth every morning.    . meloxicam (MOBIC) 15 MG tablet Take 15 mg by mouth as needed.    . metFORMIN (GLUCOPHAGE) 500 MG tablet Take 1 tablet (500 mg total) by mouth daily. 90 tablet 4  . Misc Natural Products (IMMUNE FORMULA PO) Take 2 tablets by mouth every morning. Immuneti    . Multiple Vitamins-Minerals (EYE VITAMINS PO) Take 1 capsule by mouth daily.    Marland Kitchen NEXLETOL  180 MG TABS TAKE 1 TABLET DAILY 90 tablet 3  . telmisartan (MICARDIS) 40 MG tablet TAKE 1 TABLET DAILY 90 tablet 3  . Coenzyme Q10 (CO Q10) 200 MG CAPS Co Q-10 200 mg capsule  Take by oral route. (Patient not taking: Reported on 11/06/2020)    . pravastatin (PRAVACHOL) 20 MG tablet Take 1 tablet (20 mg total) by mouth every evening. (Patient not taking: Reported on 08/09/2020) 30 tablet 5   No current facility-administered medications on file prior to visit.    LABS/IMAGING: No results found for this or any previous visit (from the past 48 hour(s)). No results found.  LIPID PANEL:    Component Value Date/Time   CHOL 143 10/31/2020 0817   TRIG 230 (H) 10/31/2020 0817   TRIG 246 (H) 12/02/2013 1239   HDL 36 (L) 10/31/2020 0817   HDL 45 12/02/2013 1239   CHOLHDL 4.0 10/31/2020 0817   CHOLHDL 6 06/07/2018 0819   VLDL 75.6 (H) 06/07/2018 0819   LDLCALC 69 10/31/2020 0817   LDLCALC 138 (H) 12/02/2013 1239   LDLDIRECT 150 (H) 04/24/2020 0949   LDLDIRECT 166.0 06/07/2018 0819    WEIGHTS: Wt Readings from Last 3 Encounters:  11/06/20 166 lb (75.3 kg)  08/09/20 164 lb 3.2 oz (74.5 kg)  08/02/20 161 lb (73 kg)     VITALS: BP 135/65   Pulse 70   Ht 5\' 3"  (1.6 m)   Wt 166 lb (75.3 kg)   LMP 10/07/2013   SpO2 99%   BMI 29.41 kg/m   EXAM: Deferred  EKG: Deferred  ASSESSMENT: 1. Mixed dyslipidemia (high LDL, triglycerides and low HDL), goal LDL <100 2. Type 2 diabetes 3. Family history of premature coronary disease - Zero (0) CAC 4. Statin intolerance - myalgias 5. Essential hypertension  PLAN: 1.   Shannon Mora has done well with the addition of pravastatin.  So far no side effects with it.  She remains on Nexletol and Vascepa.  This combination has now achieved a target with an LDL less than 100.  Will try to see if we can get the medications at lower cost.  Overall I think this is a good combination and I would likely remain on it.  If we did back off to stop her Nexletol would expect a 20% increase in her lipids.  This is really the best her numbers have looked in a while.  Follow-up with me and repeat lipids in 6 months.  Shannon Casino, MD, [facility], [place] Director of the Advanced Lipid Disorders &  Cardiovascular Risk Reduction Clinic Diplomate of the American Board of Clinical Lipidology Attending Cardiologist  Direct Dial: [phone]  Fax: [phone]  Website:  [email or web address] 11/06/2020, 10:07 AM

## 2020-11-23 ENCOUNTER — Telehealth: Payer: Self-pay | Admitting: Internal Medicine

## 2020-11-23 NOTE — Telephone Encounter (Signed)
PA for vascepa faxed to [facility] @ [phone]

## 2020-11-26 NOTE — Telephone Encounter (Signed)
Spoke with [facility] rep regarding vascepa PA (case: [id number]) Medication has been approved from dated 10/24/2020 - 08/24/2098

## 2020-11-28 MED ORDER — ICOSAPENT ETHYL 1 G PO CAPS: 2.0000 g | ORAL_CAPSULE | Freq: Two times a day (BID) | ORAL | 3 refills | Status: DC

## 2020-11-28 NOTE — Addendum Note (Signed)
Addended by: Fidel Levy on: 11/28/2020 08:59 AM   Modules accepted: Orders

## 2020-11-28 NOTE — Telephone Encounter (Signed)
Patient aware of approval. Rx(s) sent to pharmacy electronically - [facility] (per patient)

## 2020-12-20 ENCOUNTER — Other Ambulatory Visit: Payer: Self-pay | Admitting: Internal Medicine

## 2021-01-26 ENCOUNTER — Other Ambulatory Visit: Payer: Self-pay | Admitting: Internal Medicine

## 2021-02-07 ENCOUNTER — Other Ambulatory Visit: Payer: Self-pay | Admitting: Internal Medicine

## 2021-02-14 ENCOUNTER — Ambulatory Visit: Admitting: Internal Medicine

## 2021-02-14 ENCOUNTER — Other Ambulatory Visit: Payer: Self-pay

## 2021-02-14 ENCOUNTER — Encounter: Payer: Self-pay | Admitting: Internal Medicine

## 2021-02-14 VITALS — BP 122/60 | HR 82 | Ht 63.0 in | Wt 162.0 lb

## 2021-02-14 DIAGNOSIS — E663 Overweight: Secondary | ICD-10-CM | POA: Diagnosis not present

## 2021-02-14 DIAGNOSIS — E785 Hyperlipidemia, unspecified: Secondary | ICD-10-CM | POA: Diagnosis not present

## 2021-02-14 DIAGNOSIS — E119 Type 2 diabetes mellitus without complications: Secondary | ICD-10-CM

## 2021-02-14 LAB — POCT GLYCOSYLATED HEMOGLOBIN (HGB A1C): Hemoglobin A1C: 7.2 % — AB (ref 4.0–5.6)

## 2021-02-14 NOTE — Patient Instructions (Addendum)
Please continue Metformin 500 mg daily.  Please stop at the lab.  Please return in 6 months with your sugar log.

## 2021-02-14 NOTE — Progress Notes (Signed)
Patient ID: Shannon Mora, female   DOB: October 21, 1960, 60 y.o.   MRN: [id number]  This visit occurred during the SARS-CoV-2 public health emergency.  Safety protocols were in place, including screening questions prior to the visit, additional usage of staff PPE, and extensive cleaning of exam room while observing appropriate contact time as indicated for disinfecting solutions.   HPI: Shannon Mora is a 60 y.o.-year-old female, returning for follow-up for DM2, dx in 2015, prev. GDM 1989, 1992, non-insulin-dependent, controlled, without long-term complications and also hyperlipidemia.  Last visit 6 months ago.  Interim history: No increased urination, blurry vision (but had a decrease in vision), nausea, chest pain. She started walking with a neighbor - 2.5 miles, at 7:30 at night.  DM2: Reviewed HbA1c levels: Lab Results  Component Value Date   HGBA1C 6.7 (A) 08/09/2020   HGBA1C 6.2 (A) 04/13/2019   HGBA1C 6.4 (A) 06/07/2018  07/07/2017: HbA1c 6.8% 03/03/2017: HbA1c 6.5% 10/30/2016: HbA1c 6.6%  Patient is on: - Metformin 500 mg in a.m. >> at night  She is checking her sugars once a day: - am: 84-113 >> 88-99, 114 >> 116-130 >> 85, 112-131 - 2h after b'fast: n/c >> 130 >> 101 >> n/c >> 120 >> n/c - before lunch: 89-93 >> 83, 87-103 >> 103 >> 94-130 - 2h after lunch: n/c >> 124 >> 114 >> n/c - before dinner: 92-101, 113 >> 88-99 >> 113, 115 >> 86, 97 - 2h after dinner: n/c >> 180 >> 122 >> n/c - bedtime: n/c >> 146 >> n/c - nighttime: n/c Lowest sugar was 83 >> 103 >> 85; it is unclear at which level she has hypoglycemia awareness. Highest sugar was 114 >> 130 >> 131.  Glucometer: AccuCheck  Pt's meals are: - Breakfast: egg white, vegan links, coffee - Lunch: protein shake >> now salad + [place] breast (1/4 cup) - Dinner: vegetarian dish or fish once a week, occasional poultry >> veggies - no carbs - Snacks: 1-2  -No CKD, last BUN/creatinine:  Lab Results  Component Value Date    BUN 22 08/09/2020   BUN 15 06/07/2018   CREATININE 0.72 08/09/2020   CREATININE 0.74 06/07/2018  07/07/2017: Glucose 108, BUN/creatinine 14/0.75 She is on telmisartan.  - last eye exam was on 02/07/2020: No DR reportedly, + macular degeneration (pseudovitelliform dystrophy). Dr. Lady Gary and Dr. Zigmund Daniel.Coming up 02/2021.  -No numbness and tingling in her feet.  Pt has FH of DM in brother.  Hyperlipidemia: -+ Familial hyperlipidemia  Reviewed latest lipid panel: Lab Results  Component Value Date   CHOL 143 10/31/2020   CHOL 210 (H) 07/27/2020   CHOL 233 (H) 04/24/2020   Lab Results  Component Value Date   HDL 36 (L) 10/31/2020   HDL 39 (L) 07/27/2020   HDL 36 (L) 04/24/2020   Lab Results  Component Value Date   LDLCALC 69 10/31/2020   LDLCALC 134 (H) 07/27/2020   LDLCALC 145 (H) 04/24/2020   Lab Results  Component Value Date   TRIG 230 (H) 10/31/2020   TRIG 206 (H) 07/27/2020   TRIG 284 (H) 04/24/2020   Lab Results  Component Value Date   CHOLHDL 4.0 10/31/2020   CHOLHDL 5.4 (H) 07/27/2020   CHOLHDL 6.5 (H) 04/24/2020   Lab Results  Component Value Date   LDLDIRECT 150 (H) 04/24/2020   LDLDIRECT 105 (H) 10/28/2019   LDLDIRECT 104 (H) 08/24/2019   07/07/2017: 264/167/43/187 03/03/2017: 239/158/43/165  10/30/2016: 128/149/47/51 06/30/2016: 237/308/41/134 We tried fluvastatin XL (after preauthorization).  She could not tolerate this due to muscle aches and joint aches even when she was taking it every 3 days. She was on ezetimibe >> mm pain She is intolerant to statins: tried Lipitor, low-dose Crestor >> mm pain.  However, Crestor worked great for her >> LDL decreased to 51. She retried this 2020 >> joint pain She tried Fenofibrate and also Cholestyramine >> did not work. I referred her to the lipid clinic-she was started on Bempedoic acid is a PCSK9 inhibitor was not covered by [employer]. She started on generic Vascepa 2g 2x a day, also on CoQ10  No  history of hypothyroidism: 10/30/2016: TSH 1.54 Lab Results  Component Value Date   TSH 1.770 08/12/2013   Vitamin D levels were normal: Lab Results  Component Value Date   VD25OH 32.8 12/02/2013   VD25OH 39.0 August 12, 2013   Her father died in his 61s from heart disease.  She has multiple relatives on father side with hyperlipidemia and heart disease.  She was started on HCTZ by Dr. Debara Pickett.  ROS: Constitutional: no weight gain/+ weight loss, no fatigue, no subjective hyperthermia, no subjective hypothermia Eyes: no blurry vision, no xerophthalmia ENT: no sore throat, no nodules palpated in neck, no dysphagia, no odynophagia, no hoarseness Cardiovascular: no CP/no SOB/no palpitations/no leg swelling Respiratory: no cough/no SOB/no wheezing Gastrointestinal: no N/no V/no D/no C/no acid reflux Musculoskeletal: no muscle aches/no joint aches Skin: no rashes, no hair loss Neurological: no tremors/no numbness/no tingling/no dizziness  I reviewed pt's medications, allergies, PMH, social hx, family hx, and changes were documented in the history of present illness. Otherwise, unchanged from my initial visit note.  Surgeries: - tubal ligation sx 1992 - gall bladder sx - low lumbar sx  Past Medical History:  Diagnosis Date   Basal cell cancer    On forehead.   GERD (gastroesophageal reflux disease)    HSV-1 (herpes simplex virus 1) infection    Hyperglycemia    Hyperlipidemia    Perimenopausal    Vitamin D deficiency    Social History   Socioeconomic History   Marital status: Married    Spouse name: Not on file   Number of children: 2  Social Needs  Occupational History   Not on file  Tobacco Use   Smoking status: Never Smoker   Smokeless tobacco: Never Used  Substance and Sexual Activity   Alcohol use: Yes    Alcohol/week: 0.6 oz    Types: 1 Glasses of hard cider per mo    Comment: monthly   Drug use: No   Current Outpatient Medications on File Prior to Visit   Medication Sig Dispense Refill   Cholecalciferol (VITAMIN D3) 2000 units TABS Take by mouth.     Coenzyme Q10 (CO Q10) 200 MG CAPS Co Q-10 200 mg capsule  Take by oral route. (Patient not taking: Reported on 11/06/2020)     glucose blood (FREESTYLE LITE) test strip Use to check blood sugars one time daily 100 each 4   hydrochlorothiazide (MICROZIDE) 12.5 MG capsule Take 1 capsule (12.5 mg total) by mouth daily. 90 capsule 3   icosapent Ethyl (VASCEPA) 1 g capsule Take 2 capsules (2 g total) by mouth 2 (two) times daily. 360 capsule 3   Lancets (FREESTYLE) lancets Use to check blood sugars one time daily 100 each 4   Magnesium 500 MG TABS Take 1 tablet by mouth every morning.     meloxicam (MOBIC) 15 MG tablet Take 15 mg by mouth as needed.  metFORMIN (GLUCOPHAGE) 500 MG tablet TAKE 1 TABLET DAILY 90 tablet 3   Misc Natural Products (IMMUNE FORMULA PO) Take 2 tablets by mouth every morning. Immuneti     Multiple Vitamins-Minerals (EYE VITAMINS PO) Take 1 capsule by mouth daily.     NEXLETOL 180 MG TABS TAKE 1 TABLET DAILY 90 tablet 3   pravastatin (PRAVACHOL) 20 MG tablet TAKE 1 TABLET(20 MG) BY MOUTH EVERY EVENING 90 tablet 2   telmisartan (MICARDIS) 40 MG tablet TAKE 1 TABLET DAILY 90 tablet 2   No current facility-administered medications on file prior to visit.   Allergies  Allergen Reactions   Statins Other (See Comments)    Atorvastatin, rosuvastatin, simvastatin, ezetimibe   Family History  Problem Relation Age of Onset   Heart disease Father     PE: BP 122/60   Pulse 82   Ht 5\' 3"  (1.6 m)   Wt 162 lb (73.5 kg)   LMP 10/07/2013   SpO2 98%   BMI 28.70 kg/m  Wt Readings from Last 3 Encounters:  02/14/21 162 lb (73.5 kg)  11/06/20 166 lb (75.3 kg)  08/09/20 164 lb 3.2 oz (74.5 kg)   Constitutional: Slightly overweight, in NAD Eyes: PERRLA, EOMI, no exophthalmos ENT: moist mucous membranes, no thyromegaly, no cervical lymphadenopathy Cardiovascular: RRR, No  MRG Respiratory: CTA B Gastrointestinal: abdomen soft, NT, ND, BS+ Musculoskeletal: no deformities, strength intact in all 4 Skin: moist, warm, no rashes Neurological: no tremor with outstretched hands, DTR normal in all 4  ASSESSMENT: 1. DM2, non-insulin-dependent, controlled, without long term complications  2. HL  3.  Overweight  PLAN:  1. Patient with controlled type 2 diabetes, on minimal metformin dose.  Sugars improved after following a mostly plant-based diet, but before last visit she switched to a low-carb diet and HbA1c worsened; 6.7%.  Sugars were still at goal in the morning but are higher, and she was rarely checking later in the day but they were at goal whenever she was taking.  We did not change her regimen but we discussed about possibly increasing metformin if the sugars increase. -At today's visit, sugars remain approximately similar to before, up to 130-131 in a.m., and usually lower later in the day.  She is not checking sugars at all before or after dinner or at bedtime.  Based on review of her blood sugar log, the extrapolated HbA1c should be  up to 6.5%, however, HbA1c today: 7.2%, higher than expected.  Therefore, we will also check a fructosamine level for her.  If this correlates with the directly measured HbA1c, we will have her increase the metformin dose. - I suggested to:  Patient Instructions  Please continue Metformin 500 mg daily.  Please return in 6 months with your sugar log.   - advised to check sugars at different times of the day - 1x a day, rotating check times -including some checks after dinner - advised for yearly eye exams >> she is not UTD but has an appointment coming up - return to clinic in 6 months  2. HL -Patient with familial hyperlipidemia, with history of early heart disease in her father, in his early 68s -She has previous intolerances to Lipitor and Crestor (muscle cramps), even at low doses, despite improvement in her lipid panels.   We also tried fluvastatin XL but she could not tolerate it. -She tried fenofibrate and bile acid sequestrants but this did not work for her. -Tried Pravastatin this year >> in 1 week >> increased  mm pain -I referred her to the lipid clinic and she was started on Bempedoic acid as [employer] refused to cover PCSK9 inhibitor for her.  She is also on Vascepa 2 g twice a day.  She tolerates these well. -Reviewed latest lipid panel, LDL at goal, much improved, triglycerides high, HDL slightly low: Lab Results  Component Value Date   CHOL 143 10/31/2020   HDL 36 (L) 10/31/2020   LDLCALC 69 10/31/2020   LDLDIRECT 150 (H) 04/24/2020   TRIG 230 (H) 10/31/2020   CHOLHDL 4.0 10/31/2020   3.  Overweight -In the past, she did well on a modified plant-based diet and she started to lose weight but she then came off the diet.  At last visit, she was planning to restart it. -She lost 4 pounds before last visit and lost 4 more since then.  Component     Latest Ref Rng & Units 02/14/2021  Hemoglobin A1C     4.0 - 5.6 % 7.2 (A)  Fructosamine     205 - 285 umol/L 286 (H)  HbA1c calculated from fructosamine is 6.47%, better than the directly measured HbA1c.  Philemon Kingdom, MD PhD [facility] Endocrinology

## 2021-02-17 LAB — FRUCTOSAMINE: Fructosamine: 286 umol/L — ABNORMAL HIGH (ref 205–285)

## 2021-05-02 LAB — LIPID PANEL
Chol/HDL Ratio: 4.9 ratio — ABNORMAL HIGH (ref 0.0–4.4)
Cholesterol, Total: 157 mg/dL (ref 100–199)
HDL: 32 mg/dL — ABNORMAL LOW (ref >39)
LDL Chol Calc (NIH): 85 mg/dL (ref 0–99)
Triglycerides: 237 mg/dL — ABNORMAL HIGH (ref 0–149)
VLDL Cholesterol Cal: 40 mg/dL (ref 5–40)

## 2021-05-06 ENCOUNTER — Other Ambulatory Visit: Payer: Self-pay

## 2021-05-06 ENCOUNTER — Ambulatory Visit: Admitting: Internal Medicine

## 2021-05-06 ENCOUNTER — Encounter: Payer: Self-pay | Admitting: Internal Medicine

## 2021-05-06 VITALS — BP 122/80 | HR 78 | Resp 20 | Ht 63.0 in | Wt 159.0 lb

## 2021-05-06 DIAGNOSIS — E119 Type 2 diabetes mellitus without complications: Secondary | ICD-10-CM

## 2021-05-06 DIAGNOSIS — E785 Hyperlipidemia, unspecified: Secondary | ICD-10-CM

## 2021-05-06 DIAGNOSIS — T466X5A Adverse effect of antihyperlipidemic and antiarteriosclerotic drugs, initial encounter: Secondary | ICD-10-CM

## 2021-05-06 DIAGNOSIS — M791 Myalgia, unspecified site: Secondary | ICD-10-CM

## 2021-05-06 DIAGNOSIS — T466X5D Adverse effect of antihyperlipidemic and antiarteriosclerotic drugs, subsequent encounter: Secondary | ICD-10-CM

## 2021-05-06 NOTE — Patient Instructions (Signed)
Medication Instructions:  Your physician recommends that you continue on your current medications as directed. Please refer to the Current Medication list given to you today.  *If you need a refill on your cardiac medications before your next appointment, please call your pharmacy*   Lab Work: FASTING lab work to check cholesterol in about 6 months  If you have labs (blood work) drawn today and your tests are completely normal, you will receive your results only by: Lena (if you have MyChart) OR A paper copy in the mail If you have any lab test that is abnormal or we need to change your treatment, we will call you to review the results.   Testing/Procedures: NONE   Follow-Up: At [facility], you and your health needs are our priority.  As part of our continuing mission to provide you with exceptional heart care, we have created designated Provider Care Teams.  These Care Teams include your primary Cardiologist (physician) and Advanced Practice Providers (APPs -  Physician Assistants and Nurse Practitioners) who all work together to provide you with the care you need, when you need it.  We recommend signing up for the patient portal called "MyChart".  Sign up information is provided on this After Visit Summary.  MyChart is used to connect with patients for Virtual Visits (Telemedicine).  Patients are able to view lab/test results, encounter notes, upcoming appointments, etc.  Non-urgent messages can be sent to your provider as well.   To learn more about what you can do with MyChart, go to [email or web address].    Your next appointment:   12 month(s) - lipid clinic  The format for your next appointment:   In Person  Provider:   K. Mali Hilty, MD   Other Instructions

## 2021-05-06 NOTE — Progress Notes (Signed)
Lipid Clinic Consult Note  Chief Complaint:  Follow-up dyslipidemia  Primary Care Physician: Shannon Shanks, MD  HPI:  Shannon Mora is a 60 y.o. female who is being seen today for the evaluation of dyslipidemia at the request of Shannon Shanks, MD.  This is a pleasant 60 year old female with type 2 diabetes followed by Dr. Cruzita Mora in endocrinology.  She has been noted to have difficult to control dyslipidemia with primarily elevated triglycerides as well as high LDL.  Her last direct LDL 10 days ago was 166.  Total cholesterol was 233, triglycerides 378 and HDL of 38.5.  Non-HDL cholesterol was 194 (approximately 30 points higher as expected then LDL cholesterol, suggesting concordance).  Unfortunately, Shannon Mora has had difficulty with medications.  She has had side effects with multiple statin medications including atorvastatin, rosuvastatin, simvastatin, ezetimibe, and alternatives such as the fibrates.  Her A1c is well controlled at 6.4.  She is also not an alcohol user.  There is family history of early onset heart disease in her father.  Really she is asymptomatic, denying any chest pain, worsening shortness of breath or associated symptoms.  Her statin side effects and not only included myalgias but significant back pain and joint stiffness.  She also had symptoms with fenofibrate and cholestyramine.  She did get some improvement in her LDL cholesterol on rosuvastatin, but again it was not tolerated.  02/21/2019  Shannon Mora is seen here for follow-up.  She had a marked dyslipidemia as above and underwent CT coronary calcium scoring.  Fortunately showed 0 coronary calcium score.  This is associated with low risk of events.  We had recommended pursuing PCSK9 inhibitor therapy given her intolerance to statins however this was declined due to lack of ASCVD.  Based on this, elected to pursue therapy with a newly available Nexletol.  She has been taking this now for a month and seems to be  tolerating it well.  Although it is only moderate potency, this should be associated with some improvement in her symptoms.  Optionally, we may be able to add ezetimibe to this therapy.  She apparently had had side effects to this however it may have been in combination with simvastatin and the statin is certainly more likely to cause her side effects.  Also today, she noted that blood pressures been elevated.  Although we have been seeing her for dyslipidemia, her PCP requested that we evaluated her for hypertension.  I reviewed blood pressures over the last couple of years which showed generally blood pressures between [id number] systolic.  As a diabetic, she would benefit for them renal protection with ACE or ARB.  10/31/2019  Shannon Mora returns today for follow-up.  She has persistently elevated triglycerides now 273.  Total cholesterol 177, HDL 36 and LDL at target at 95.  This is based on a 0 calcium score therefore I felt that we did not need to be quite as aggressive with her risk modification.  Her diabetes has been very well controlled.  She sees endocrinology and her A1c was somewhere around 6.2.  We talked about options for lowering her triglycerides, including the fibrate and or Vascepa.  There is little evidence for risk reduction with a fibrate however as diabetic with at least 2 risk factors, she could qualify for Vascepa.  Despite this, she is not interested in additional medications at this time.  She wants to work on more physical activity and weight loss.   05/01/2020  Shannon Mora is  seen today in follow-up.  Its been now 41-monthsince we last saw each other.  She was planning to work on diet and make other adjustments in her lifestyle and activities.  Unfortunately her lipids are not much better.  LDL well it was at target of 95 is now 150, total cholesterol 233, HDL 36 and triglycerides 284.  She is compliant with the Nexletol.  She said diet has not been as ideal and her exercise levels are  less.  08/02/2020  Shannon Mora seen today for follow-up.  She reports be more active and is now working part-time job.  She has made some dietary changes and is using generic icosapent ethyl in addition to her bempedoic acid.  The combination of medications has helped her cholesterol with improvement in her triglycerides and a small improvement in LDL.  Her total cholesterol has come down to 210 and LDL is 134.  Triglycerides are still elevated 206 but improved from previous at 284.  11/06/2020  Shannon Mora seen today in follow-up.  Her lipids are remarkably better.  Her total cholesterol is now down to 143 from 210, triglycerides 230, HDL 36 and LDL 69 down from 134.  This is with the recent addition of pravastatin to her Nexletol.  She remains also on Vascepa.  Overall I think it is a good combination of medications.  Cost is the only other outstanding issue with these.  We may be able to get the cost of her Vascepa down.  05/06/2021  Shannon Mora seen today for follow-up.  Her cholesterol has trended up a little however still within the target range.  Her total cholesterol now is 157, HDL 32, triglycerides 237 and LDL 85.  She is tolerating the medications and they are affordable for her.  She reports regular physical exercise walking about 2 and half miles every day.  Her A1c is come down significantly to 6.4%.  She has follow-up with her endocrinologist in December.  PMHx:  Past Medical History:  Diagnosis Date   Basal cell cancer    On forehead.   GERD (gastroesophageal reflux disease)    HSV-1 (herpes simplex virus 1) infection    Hyperglycemia    Hyperlipidemia    Perimenopausal    Vitamin D deficiency      FAMHx:  Family History  Problem Relation Age of Onset   Heart disease Father     SOCHx:   reports that she has never smoked. She has never used smokeless tobacco. She reports current alcohol use of about 1.0 standard drink per week. She reports that she does not use  drugs.  ALLERGIES:  Allergies  Allergen Reactions   Statins Other (See Comments)    Atorvastatin, rosuvastatin, simvastatin, ezetimibe    ROS: Pertinent items noted in HPI and remainder of comprehensive ROS otherwise negative.  HOME MEDS: Current Outpatient Medications on File Prior to Visit  Medication Sig Dispense Refill   Cholecalciferol (VITAMIN D3) 2000 units TABS Take by mouth.     glucose blood (FREESTYLE LITE) test strip Use to check blood sugars one time daily 100 each 4   hydrochlorothiazide (MICROZIDE) 12.5 MG capsule Take 1 capsule (12.5 mg total) by mouth daily. 90 capsule 3   icosapent Ethyl (VASCEPA) 1 g capsule Take 2 capsules (2 g total) by mouth 2 (two) times daily. 360 capsule 3   Lancets (FREESTYLE) lancets Use to check blood sugars one time daily 100 each 4   Magnesium 500 MG TABS  Take 1 tablet by mouth every morning.     meloxicam (MOBIC) 15 MG tablet Take 15 mg by mouth as needed.     metFORMIN (GLUCOPHAGE) 500 MG tablet TAKE 1 TABLET DAILY 90 tablet 3   Misc Natural Products (IMMUNE FORMULA PO) Take 2 tablets by mouth every morning. Immuneti     Multiple Vitamins-Minerals (EYE VITAMINS PO) Take 1 capsule by mouth daily.     NEXLETOL 180 MG TABS TAKE 1 TABLET DAILY 90 tablet 3   pravastatin (PRAVACHOL) 20 MG tablet TAKE 1 TABLET(20 MG) BY MOUTH EVERY EVENING 90 tablet 2   telmisartan (MICARDIS) 40 MG tablet TAKE 1 TABLET DAILY 90 tablet 2   No current facility-administered medications on file prior to visit.    LABS/IMAGING: No results found for this or any previous visit (from the past 48 hour(s)). No results found.  LIPID PANEL:    Component Value Date/Time   CHOL 157 05/01/2021 0828   TRIG 237 (H) 05/01/2021 0828   TRIG 246 (H) 12/02/2013 1239   HDL 32 (L) 05/01/2021 0828   HDL 45 12/02/2013 1239   CHOLHDL 4.9 (H) 05/01/2021 0828   CHOLHDL 6 06/07/2018 0819   VLDL 75.6 (H) 06/07/2018 0819   LDLCALC 85 05/01/2021 0828   LDLCALC 138 (H)  12/02/2013 1239   LDLDIRECT 150 (H) 04/24/2020 0949   LDLDIRECT 166.0 06/07/2018 0819    WEIGHTS: Wt Readings from Last 3 Encounters:  05/06/21 159 lb (72.1 kg)  02/14/21 162 lb (73.5 kg)  11/06/20 166 lb (75.3 kg)    VITALS: Pulse 78   Resp 20   Ht '5\' 3"'$  (1.6 m)   Wt 159 lb (72.1 kg)   LMP 10/07/2013   SpO2 96%   BMI 28.17 kg/m   EXAM: Deferred  EKG: Deferred  ASSESSMENT: Mixed dyslipidemia (high LDL, triglycerides and low HDL), goal LDL <100 Type 2 diabetes-A1c 6.4% Family history of premature coronary disease - Zero (0) CAC Statin intolerance - myalgias Essential hypertension  PLAN: 1.   Ms. Kanak continues to do well with good control over her cholesterol.  Her LDL is up somewhat however this may be due to an increase in meats in her diet.  She is exercising more and her A1c has improved significantly down to 6.4%.  Overall she is doing well.  She seems to be tolerating the low-dose of pravastatin as well without any myalgias.  Follow-up with me in 1 year and repeat lipids in 6 months.  Pixie Casino, MD, [facility], [place] Director of the Advanced Lipid Disorders &  Cardiovascular Risk Reduction Clinic Diplomate of the American Board of Clinical Lipidology Attending Cardiologist  Direct Dial: [phone]  Fax: [phone]  Website:  [email or web address] 05/06/2021, 9:07 AM

## 2021-08-15 ENCOUNTER — Ambulatory Visit: Admitting: Internal Medicine

## 2021-10-04 ENCOUNTER — Other Ambulatory Visit: Payer: Self-pay | Admitting: Internal Medicine

## 2021-10-04 DIAGNOSIS — I10 Essential (primary) hypertension: Secondary | ICD-10-CM

## 2021-10-22 ENCOUNTER — Other Ambulatory Visit: Payer: Self-pay | Admitting: Internal Medicine

## 2021-10-24 ENCOUNTER — Other Ambulatory Visit: Payer: Self-pay

## 2021-11-14 ENCOUNTER — Encounter: Payer: Self-pay | Admitting: Internal Medicine

## 2021-11-14 ENCOUNTER — Other Ambulatory Visit: Payer: Self-pay | Admitting: Internal Medicine

## 2021-11-15 ENCOUNTER — Other Ambulatory Visit: Payer: Self-pay

## 2021-11-15 ENCOUNTER — Encounter: Payer: Self-pay | Admitting: Internal Medicine

## 2021-11-15 ENCOUNTER — Ambulatory Visit (INDEPENDENT_AMBULATORY_CARE_PROVIDER_SITE_OTHER): Admitting: Internal Medicine

## 2021-11-15 VITALS — BP 122/72 | HR 79 | Ht 63.0 in | Wt 162.8 lb

## 2021-11-15 DIAGNOSIS — E119 Type 2 diabetes mellitus without complications: Secondary | ICD-10-CM | POA: Diagnosis not present

## 2021-11-15 DIAGNOSIS — E663 Overweight: Secondary | ICD-10-CM

## 2021-11-15 DIAGNOSIS — I10 Essential (primary) hypertension: Secondary | ICD-10-CM

## 2021-11-15 DIAGNOSIS — E785 Hyperlipidemia, unspecified: Secondary | ICD-10-CM

## 2021-11-15 LAB — MICROALBUMIN / CREATININE URINE RATIO
Creatinine,U: 45.6 mg/dL
Microalb Creat Ratio: 1.5 mg/g (ref 0.0–30.0)
Microalb, Ur: <0.7 mg/dL (ref 0.0–1.9)

## 2021-11-15 LAB — POCT GLYCOSYLATED HEMOGLOBIN (HGB A1C): Hemoglobin A1C: 6.9 % — AB (ref 4.0–5.6)

## 2021-11-15 MED ORDER — METFORMIN HCL 500 MG PO TABS
500.0000 mg | ORAL_TABLET | Freq: Every day | ORAL | 3 refills | Status: DC
Start: 2021-11-15 — End: 2022-05-20

## 2021-11-15 MED ORDER — HYDROCHLOROTHIAZIDE 12.5 MG PO CAPS: ORAL_CAPSULE | ORAL | 3 refills | Status: DC

## 2021-11-15 NOTE — Progress Notes (Addendum)
Patient ID: Shannon Mora, female   DOB: Sep 07, 1960, 61 y.o.   MRN: [id number] ? ?This visit occurred during the SARS-CoV-2 public health emergency.  Safety protocols were in place, including screening questions prior to the visit, additional usage of staff PPE, and extensive cleaning of exam room while observing appropriate contact time as indicated for disinfecting solutions.  ? ?HPI: ?Shannon Mora is a 61 y.o.-year-old female, returning for follow-up for DM2, dx in 2015, prev. GDM 1989, 1992, non-insulin-dependent, controlled, without long-term complications and also hyperlipidemia.  Last visit 9 months ago. ? ?Interim history: ?No increased urination, blurry vision, nausea, chest pain. ? ?DM2: ?Reviewed HbA1c levels: ?Lab Results  ?Component Value Date  ? HGBA1C 7.2 (A) 02/14/2021  ? HGBA1C 6.7 (A) 08/09/2020  ? HGBA1C 6.2 (A) 04/13/2019  ?07/07/2017: HbA1c 6.8% ?03/03/2017: HbA1c 6.5% ?10/30/2016: HbA1c 6.6% ? ?Patient is on: ?- Metformin 500 mg in a.m. >> at night ? ?She is checking her sugars once a day: ?- am: 84-113 >> 88-99, 114 >> 116-130 >> 85, 112-131 >> 112-137 ?- 2h after b'fast: n/c >> 130 >> 101 >> n/c >> 120 >> n/c >> 115 ?- before lunch: 89-93 >> 83, 87-103 >> 103 >> 94-130 >> 98, 111 ?- 2h after lunch: n/c >> 124 >> 114 >> n/c >> 111, 125 ?- before dinner: 92-101, 113 >> 88-99 >> 113, 115 >> 86, 97 >> n/c ?- 2h after dinner: n/c >> 180 >> 122 >> n/c >> 103, 121 ?- bedtime: n/c >> 146 >> n/c >> 121, 168 ?- nighttime: n/c ?Lowest sugar was 83 >> 103 >> 85; it is unclear at which level she has hypoglycemia awareness. ?Highest sugar was 114 >> 130 >> 131. ? ?Glucometer: AccuCheck ? ?Pt's meals are: ?- Breakfast: egg white, vegan links, coffee ?- Lunch: protein shake >> now salad + [place] breast (1/4 cup) ?- Dinner: vegetarian dish or fish once a week, occasional poultry >> veggies - no carbs ?- Snacks: 1-2 ? ?-No CKD, last BUN/creatinine:  ?Lab Results  ?Component Value Date  ? BUN 22 08/09/2020  ? BUN  15 06/07/2018  ? CREATININE 0.72 08/09/2020  ? CREATININE 0.74 06/07/2018  ?07/07/2017: Glucose 108, BUN/creatinine 14/0.75 ?She is on telmisartan. ? ?- last eye exam was on 02/2021: No DR reportedly, + macular degeneration (pseudovitelliform dystrophy). Dr. Lady Gary and Dr. Zigmund Daniel. ? ?-No numbness and tingling in her feet. ? ?Pt has FH of DM in brother. ? ?Hyperlipidemia: ?-+ Familial hyperlipidemia ? ?Reviewed latest lipid panel: ?Lab Results  ?Component Value Date  ? CHOL 157 05/01/2021  ? CHOL 143 10/31/2020  ? CHOL 210 (H) 07/27/2020  ? ?Lab Results  ?Component Value Date  ? HDL 32 (L) 05/01/2021  ? HDL 36 (L) 10/31/2020  ? HDL 39 (L) 07/27/2020  ? ?Lab Results  ?Component Value Date  ? [place] 85 05/01/2021  ? [place] 69 10/31/2020  ? LDLCALC 134 (H) 07/27/2020  ? ?Lab Results  ?Component Value Date  ? TRIG 237 (H) 05/01/2021  ? TRIG 230 (H) 10/31/2020  ? TRIG 206 (H) 07/27/2020  ? ?Lab Results  ?Component Value Date  ? CHOLHDL 4.9 (H) 05/01/2021  ? CHOLHDL 4.0 10/31/2020  ? CHOLHDL 5.4 (H) 07/27/2020  ? ?Lab Results  ?Component Value Date  ? LDLDIRECT 150 (H) 04/24/2020  ? LDLDIRECT 105 (H) 10/28/2019  ? LDLDIRECT 104 (H) 08/24/2019  ? ?07/07/2017: 264/167/43/187 ?03/03/2017: 239/158/43/165  ?10/30/2016: 128/149/47/51 ?06/30/2016: 237/308/41/134 ?We tried fluvastatin XL (after preauthorization).  She could not tolerate this  due to muscle aches and joint aches even when she was taking it every 3 days. ?She was on ezetimibe >> mm pain ?She is intolerant to statins: tried Lipitor, low-dose Crestor >> mm pain.  However, Crestor worked great for her >> LDL decreased to 51. She retried this 2020 >> joint pain ?She tried Fenofibrate and also Cholestyramine >> did not work. ?I referred her to the lipid clinic-she was started on Bempedoic acid as a PCSK9 inhibitor was not covered by [employer]. She started on generic Vascepa 2g 2x a day, also on CoQ10 ? ?No history of hypothyroidism: ?10/30/2016: TSH 1.54 ?Lab Results   ?Component Value Date  ? TSH 1.770 Aug 05, 2013  ? ?Vitamin D levels were normal: ?Lab Results  ?Component Value Date  ? VD25OH 32.8 12/02/2013  ? VD25OH 39.0 2013/08/05  ? ?Her father died in his 45s from heart disease.  She has multiple relatives on father side with hyperlipidemia and heart disease. ? ?She was started on HCTZ by Dr. Debara Pickett. ? ?ROS: ?+ see HPI ? ?I reviewed pt's medications, allergies, PMH, social hx, family hx, and changes were documented in the history of present illness. Otherwise, unchanged from my initial visit note. ? ?Surgeries: ?- tubal ligation sx 1992 ?- gall bladder sx ?- low lumbar sx ? ?Past Medical History:  ?Diagnosis Date  ? Basal cell cancer   ? On forehead.  ? GERD (gastroesophageal reflux disease)   ? HSV-1 (herpes simplex virus 1) infection   ? Hyperglycemia   ? Hyperlipidemia   ? Perimenopausal   ? Vitamin D deficiency   ? ?Social History  ? ?Socioeconomic History  ? Marital status: Married  ?  Spouse name: Not on file  ? Number of children: 2  ?Social Needs  ?Occupational History  ? Not on file  ?Tobacco Use  ? Smoking status: Never Smoker  ? Smokeless tobacco: Never Used  ?Substance and Sexual Activity  ? Alcohol use: Yes  ?  Alcohol/week: 0.6 oz  ?  Types: 1 Glasses of hard cider per mo  ?  Comment: monthly  ? Drug use: No  ? ?Current Outpatient Medications on File Prior to Visit  ?Medication Sig Dispense Refill  ? Cholecalciferol (VITAMIN D3) 2000 units TABS Take by mouth.    ? glucose blood (FREESTYLE LITE) test strip Use to check blood sugars one time daily 100 each 4  ? hydrochlorothiazide (MICROZIDE) 12.5 MG capsule TAKE 1 CAPSULE(12.5 MG) BY MOUTH DAILY 90 capsule 3  ? icosapent Ethyl (VASCEPA) 1 g capsule TAKE 2 CAPSULES TWICE A DAY 360 capsule 3  ? Lancets (FREESTYLE) lancets Use to check blood sugars one time daily 100 each 4  ? Magnesium 500 MG TABS Take 1 tablet by mouth every morning.    ? meloxicam (MOBIC) 15 MG tablet Take 15 mg by mouth as needed.    ? metFORMIN  (GLUCOPHAGE) 500 MG tablet TAKE 1 TABLET DAILY 90 tablet 3  ? Misc Natural Products (IMMUNE FORMULA PO) Take 2 tablets by mouth every morning. Immuneti    ? Multiple Vitamins-Minerals (EYE VITAMINS PO) Take 1 capsule by mouth daily.    ? NEXLETOL 180 MG TABS TAKE 1 TABLET DAILY 90 tablet 3  ? pravastatin (PRAVACHOL) 20 MG tablet TAKE 1 TABLET(20 MG) BY MOUTH EVERY EVENING 90 tablet 2  ? telmisartan (MICARDIS) 40 MG tablet TAKE 1 TABLET DAILY 90 tablet 2  ? ?No current facility-administered medications on file prior to visit.  ? ?Allergies  ?Allergen Reactions  ?  Statins Other (See Comments)  ?  Atorvastatin, rosuvastatin, simvastatin, ezetimibe  ? ?Family History  ?Problem Relation Age of Onset  ? Heart disease Father   ? ? ?PE: ?BP 122/72 (BP Location: Left Arm, Patient Position: Sitting, Cuff Size: Normal)   Pulse 79   Ht '5\' 3"'$  (1.6 m)   Wt 162 lb 12.8 oz (73.8 kg)   LMP 10/07/2013   SpO2 99%   BMI 28.84 kg/m?  ?Wt Readings from Last 3 Encounters:  ?11/15/21 162 lb 12.8 oz (73.8 kg)  ?05/06/21 159 lb (72.1 kg)  ?02/14/21 162 lb (73.5 kg)  ? ?Constitutional: Slightly overweight, in NAD ?Eyes: PERRLA, EOMI, no exophthalmos ?ENT: moist mucous membranes, no thyromegaly, no cervical lymphadenopathy ?Cardiovascular: RRR, No MRG ?Respiratory: CTA B ?Musculoskeletal: no deformities, strength intact in all 4 ?Skin: moist, warm, no rashes ?Neurological: no tremor with outstretched hands, DTR normal in all 4 ?Diabetic Foot Exam - Simple   ?Simple Foot Form ?Diabetic Foot exam was performed with the following findings: Yes 11/15/2021  1:51 PM  ?Visual Inspection ?No deformities, no ulcerations, no other skin breakdown bilaterally: Yes ?Sensation Testing ?Intact to touch and monofilament testing bilaterally: Yes ?Pulse Check ?Posterior Tibialis and Dorsalis pulse intact bilaterally: Yes ?Comments ?  ? ?ASSESSMENT: ?1. DM2, non-insulin-dependent, controlled, without long term complications ? ?2. HL ? ?3.   Overweight ? ?PLAN:  ?1. Patient with controlled type 2 diabetes, on a minimalistic regimen with low-dose metformin only.  In the past sugars improved thousand following a mostly plant-based diet but after changing this to a l

## 2021-11-15 NOTE — Patient Instructions (Signed)
Please continue Metformin 500 mg daily. ? ?Please stop at the lab. ? ?Please return in 6 months with your sugar log.  ?

## 2021-11-15 NOTE — Addendum Note (Signed)
Addended by: Lauralyn Primes on: 11/15/2021 02:00 PM ? ? Modules accepted: Orders ? ?

## 2021-11-16 LAB — COMPLETE METABOLIC PANEL WITH GFR
AG Ratio: 1.9 (calc) (ref 1.0–2.5)
ALT: 20 U/L (ref 6–29)
AST: 20 U/L (ref 10–35)
Albumin: 4.6 g/dL (ref 3.6–5.1)
Alkaline phosphatase (APISO): 62 U/L (ref 37–153)
BUN: 23 mg/dL (ref 7–25)
CO2: 29 mmol/L (ref 20–32)
Calcium: 10.4 mg/dL (ref 8.6–10.4)
Chloride: 102 mmol/L (ref 98–110)
Creat: 0.83 mg/dL (ref 0.50–1.03)
Globulin: 2.4 g/dL (calc) (ref 1.9–3.7)
Glucose, Bld: 120 mg/dL — ABNORMAL HIGH (ref 65–99)
Potassium: 3.8 mmol/L (ref 3.5–5.3)
Sodium: 141 mmol/L (ref 135–146)
Total Bilirubin: 0.4 mg/dL (ref 0.2–1.2)
Total Protein: 7 g/dL (ref 6.1–8.1)
eGFR: 81 mL/min/{1.73_m2} (ref >=60)

## 2021-11-21 LAB — LIPID PANEL
Chol/HDL Ratio: 4.9 ratio — ABNORMAL HIGH (ref 0.0–4.4)
Cholesterol, Total: 191 mg/dL (ref 100–199)
HDL: 39 mg/dL — ABNORMAL LOW (ref >39)
LDL Chol Calc (NIH): 127 mg/dL — ABNORMAL HIGH (ref 0–99)
Triglycerides: 139 mg/dL (ref 0–149)
VLDL Cholesterol Cal: 25 mg/dL (ref 5–40)

## 2021-11-25 ENCOUNTER — Other Ambulatory Visit: Payer: Self-pay | Admitting: *Deleted

## 2021-11-25 DIAGNOSIS — E785 Hyperlipidemia, unspecified: Secondary | ICD-10-CM

## 2021-11-25 MED ORDER — PRAVASTATIN SODIUM 40 MG PO TABS: 40.0000 mg | ORAL_TABLET | Freq: Every evening | ORAL | 3 refills | Status: DC

## 2021-12-22 ENCOUNTER — Other Ambulatory Visit: Payer: Self-pay | Admitting: Internal Medicine

## 2022-02-27 ENCOUNTER — Other Ambulatory Visit: Payer: Self-pay | Admitting: Internal Medicine

## 2022-05-06 ENCOUNTER — Ambulatory Visit (HOSPITAL_BASED_OUTPATIENT_CLINIC_OR_DEPARTMENT_OTHER): Admitting: Internal Medicine

## 2022-05-07 ENCOUNTER — Telehealth: Admitting: Internal Medicine

## 2022-05-20 ENCOUNTER — Ambulatory Visit: Admitting: Internal Medicine

## 2022-05-20 ENCOUNTER — Encounter: Payer: Self-pay | Admitting: Internal Medicine

## 2022-05-20 VITALS — BP 128/82 | HR 82 | Ht 63.0 in | Wt 162.0 lb

## 2022-05-20 DIAGNOSIS — E663 Overweight: Secondary | ICD-10-CM

## 2022-05-20 DIAGNOSIS — E119 Type 2 diabetes mellitus without complications: Secondary | ICD-10-CM | POA: Diagnosis not present

## 2022-05-20 DIAGNOSIS — E785 Hyperlipidemia, unspecified: Secondary | ICD-10-CM | POA: Diagnosis not present

## 2022-05-20 LAB — POCT GLYCOSYLATED HEMOGLOBIN (HGB A1C): Hemoglobin A1C: 7.1 % — AB (ref 4.0–5.6)

## 2022-05-20 MED ORDER — METFORMIN HCL 500 MG PO TABS: 500.0000 mg | ORAL_TABLET | Freq: Two times a day (BID) | ORAL | 3 refills | Status: DC

## 2022-05-20 NOTE — Progress Notes (Signed)
Patient ID: Shannon Mora, female   DOB: 1961-01-13, 61 y.o.   MRN: [id number]  HPI: Shannon Mora is a 61 y.o.-year-old female, returning for follow-up for DM2, dx in 2015, prev. GDM 1989, 1992, non-insulin-dependent, controlled, without long-term complications and also hyperlipidemia.  Last visit 6 months ago.  Interim history: No increased urination, blurry vision, nausea, chest pain. She had occasional headaches since last OV.  DM2: Reviewed HbA1c levels: Lab Results  Component Value Date   HGBA1C 6.9 (A) 11/15/2021   HGBA1C 7.2 (A) 02/14/2021   HGBA1C 6.7 (A) 08/09/2020  07/07/2017: HbA1c 6.8% 03/03/2017: HbA1c 6.5% 10/30/2016: HbA1c 6.6%  Patient is on: - Metformin 500 mg in a.m. >> at night  She is checking her sugars once a day: - am: 116-130 >> 85, 112-131 >> 112-137 >> 113-133, 146 - 2h after b'fast: 101 >> n/c >> 120 >> n/c >> 115 >> 143 - before lunch: 103 >> 94-130 >> 98, 111 >> 119 - 2h after lunch: 124 >> 114 >> n/c >> 111, 125 >> 119 - before dinner: 88-99 >> 113, 115 >> 86, 97 >> 121 - 2h after dinner: 122 >> n/c >> 103, 121 >> 150, 200 - bedtime: n/c >> 146 >> n/c >> 121, 168 >> n/c - nighttime: n/c Lowest sugar was 83 >> 103 >> 8 >> 113; it is unclear at which level she has hypoglycemia awareness. Highest sugar was 114 >> 130 >> 131 >> 200 (rice).  Glucometer: AccuCheck  Pt's meals are: - Breakfast: egg white, vegan links, coffee - Lunch: protein shake >> salad + [place] breast (1/4 cup) - Dinner: vegetarian dish or fish/poultry - Snacks: 1-2  -No CKD, last BUN/creatinine:  Lab Results  Component Value Date   BUN 23 11/15/2021   BUN 22 08/09/2020   CREATININE 0.83 11/15/2021   CREATININE 0.72 08/09/2020  07/07/2017: Glucose 108, BUN/creatinine 14/0.75 She is on telmisartan.  - last eye exam was 03/20/2022: No DR reportedly, + macular degeneration (pseudovitelliform dystrophy). Dr. Lady Gary and Dr. Zigmund Daniel.  -No numbness and tingling in her feet.   Last foot exam 10/2021.  Pt has FH of DM in brother.  Hyperlipidemia: -+ Familial hyperlipidemia  Reviewed latest lipid panel: Lab Results  Component Value Date   CHOL 191 11/20/2021   CHOL 157 05/01/2021   CHOL 143 10/31/2020   Lab Results  Component Value Date   HDL 39 (L) 11/20/2021   HDL 32 (L) 05/01/2021   HDL 36 (L) 10/31/2020   Lab Results  Component Value Date   LDLCALC 127 (H) 11/20/2021   LDLCALC 85 05/01/2021   LDLCALC 69 10/31/2020   Lab Results  Component Value Date   TRIG 139 11/20/2021   TRIG 237 (H) 05/01/2021   TRIG 230 (H) 10/31/2020   Lab Results  Component Value Date   CHOLHDL 4.9 (H) 11/20/2021   CHOLHDL 4.9 (H) 05/01/2021   CHOLHDL 4.0 10/31/2020   Lab Results  Component Value Date   LDLDIRECT 150 (H) 04/24/2020   LDLDIRECT 105 (H) 10/28/2019   LDLDIRECT 104 (H) 08/24/2019   07/07/2017: 264/167/43/187 03/03/2017: 239/158/43/165  10/30/2016: 128/149/47/51 06/30/2016: 237/308/41/134 We tried fluvastatin XL (after preauthorization).  She could not tolerate this due to muscle aches and joint aches even when she was taking it every 3 days. She was on ezetimibe >> mm pain She is intolerant to statins: tried Lipitor, low-dose Crestor >> mm pain.  However, Crestor worked great for her >> LDL decreased to 51. She retried this 2020 >>  joint pain She tried Fenofibrate and also Cholestyramine >> did not work. I referred her to the lipid clinic-she was started on Bempedoic acid as a PCSK9 inhibitor was not covered by [employer]. She started on generic Vascepa 2g 2x a day, also on CoQ10  No history of hypothyroidism: 10/30/2016: TSH 1.54 Lab Results  Component Value Date   TSH 1.770 10-Aug-2013   Vitamin D levels were normal: Lab Results  Component Value Date   VD25OH 32.8 12/02/2013   VD25OH 39.0 08-10-2013   Her father died in his 48s from heart disease.  She has multiple relatives on father side with hyperlipidemia and heart disease.  ROS: + see  HPI  I reviewed pt's medications, allergies, PMH, social hx, family hx, and changes were documented in the history of present illness. Otherwise, unchanged from my initial visit note.  Surgeries: - tubal ligation sx 1992 - gall bladder sx - low lumbar sx  Past Medical History:  Diagnosis Date   Basal cell cancer    On forehead.   GERD (gastroesophageal reflux disease)    HSV-1 (herpes simplex virus 1) infection    Hyperglycemia    Hyperlipidemia    Perimenopausal    Vitamin D deficiency    Social History   Socioeconomic History   Marital status: Married    Spouse name: Not on file   Number of children: 2  Social Needs  Occupational History   Not on file  Tobacco Use   Smoking status: Never Smoker   Smokeless tobacco: Never Used  Substance and Sexual Activity   Alcohol use: Yes    Alcohol/week: 0.6 oz    Types: 1 Glasses of hard cider per mo    Comment: monthly   Drug use: No   Current Outpatient Medications on File Prior to Visit  Medication Sig Dispense Refill   pravastatin (PRAVACHOL) 40 MG tablet Take 1 tablet (40 mg total) by mouth every evening. 90 tablet 3   Cholecalciferol (VITAMIN D3) 2000 units TABS Take by mouth.     glucose blood (FREESTYLE LITE) test strip Use to check blood sugars one time daily 100 each 4   hydrochlorothiazide (MICROZIDE) 12.5 MG capsule TAKE 1 CAPSULE(12.5 MG) BY MOUTH DAILY 90 capsule 3   icosapent Ethyl (VASCEPA) 1 g capsule TAKE 2 CAPSULES TWICE A DAY 360 capsule 3   Lancets (FREESTYLE) lancets Use to check blood sugars one time daily 100 each 4   Magnesium 500 MG TABS Take 1 tablet by mouth every morning.     meloxicam (MOBIC) 15 MG tablet Take 15 mg by mouth as needed.     metFORMIN (GLUCOPHAGE) 500 MG tablet Take 1 tablet (500 mg total) by mouth daily. 90 tablet 3   Multiple Vitamins-Minerals (EYE VITAMINS PO) Take 1 capsule by mouth daily.     NEXLETOL 180 MG TABS TAKE 1 TABLET DAILY 90 tablet 3   telmisartan (MICARDIS) 40 MG  tablet TAKE 1 TABLET DAILY 90 tablet 3   No current facility-administered medications on file prior to visit.   Allergies  Allergen Reactions   Statins Other (See Comments)    Atorvastatin, rosuvastatin, simvastatin, ezetimibe   Family History  Problem Relation Age of Onset   Heart disease Father     PE: BP 128/82 (BP Location: Left Arm, Patient Position: Sitting, Cuff Size: Normal)   Pulse 82   Ht '5\' 3"'$  (1.6 m)   Wt 162 lb (73.5 kg)   LMP 10/07/2013   SpO2 99%  BMI 28.70 kg/m  Wt Readings from Last 3 Encounters:  05/20/22 162 lb (73.5 kg)  11/15/21 162 lb 12.8 oz (73.8 kg)  05/06/21 159 lb (72.1 kg)   Constitutional: Slightly overweight, in NAD Eyes: EOMI, no exophthalmos ENT: no thyromegaly, no cervical lymphadenopathy Cardiovascular: RRR, No MRG Respiratory: CTA B Musculoskeletal: no deformities Skin: no rashes Neurological: no tremor with outstretched hands  ASSESSMENT: 1. DM2, non-insulin-dependent, controlled, without long term complications  2. HL  3.  Overweight  PLAN:  1. Patient with history of controlled type 2 diabetes, on a minimalistic regimen with low-dose metformin only.  At last visit, HbA1c improved from 7.2% to 6.9%.  Sugars appears to be at goal, with only few very mildly elevated blood sugars in the morning, in the 130s.  I did not suggest a change in regimen at that time. -This visit, sugars appear to be higher than before later in the day, but mostly at target in the morning with only occasional slightly higher values.  He did have some dietary discretions over the summer but she is working on improving her diet.  For now, I also suggested an increase in the metformin dose to twice a day.  After discussion about the multiple benefits metformin, she agrees with this change. - I suggested to:  Patient Instructions  Please increase:  - Metformin 500 mg 2x with meals  Please return in 4 months with your sugar log.   - we checked her HbA1c:  7.1% (higher) - advised to check sugars at different times of the day - 1x a day, rotating check times - advised for yearly eye exams >> she is UTD - return to clinic in 4 months  2. HL -Patient with familial hyperlipidemia, with history of early heart disease in her father, in his early 37s -She has previous intolerances to Lipitor and Crestor (muscle cramps), even at low doses, despite improvement in her lipid panels.  She could not tolerate fluvastatin XL and pravastatin due to muscle cramps. -She is fenofibrate and bile acid sequestrant and these did not work well for her -I referred her to the lipid clinic and she was started on Bempedoic acid as [employer] refused to cover PCSK9 inhibitor for her.  She also on Vascepa 2 g twice a day, tolerated well. -Reviewed latest lipid panel from last visit: LDL above target, HDL slightly low: Lab Results  Component Value Date   CHOL 191 11/20/2021   HDL 39 (L) 11/20/2021   LDLCALC 127 (H) 11/20/2021   LDLDIRECT 150 (H) 04/24/2020   TRIG 139 11/20/2021   CHOLHDL 4.9 (H) 11/20/2021  - next cardiology appointment is on 07/25/2022 and will have another lipid panel checked at that time.  3.  Overweight -In the past, she did well on a modified plant-based diet when she came off the diet -Weight was stable at last visit, previously lost 8 pounds before the previous 2 visits -Weight is stable at this visit  Philemon Kingdom, MD PhD [facility]

## 2022-05-20 NOTE — Patient Instructions (Addendum)
Please increase:  - Metformin 500 mg 2x with meals  Please return in 4 months with your sugar log.

## 2022-07-09 ENCOUNTER — Encounter (HOSPITAL_BASED_OUTPATIENT_CLINIC_OR_DEPARTMENT_OTHER): Payer: Self-pay | Admitting: Internal Medicine

## 2022-07-15 ENCOUNTER — Encounter: Payer: Self-pay | Admitting: Internal Medicine

## 2022-07-15 LAB — LIPID PANEL
Chol/HDL Ratio: 4.6 ratio — ABNORMAL HIGH (ref 0.0–4.4)
Cholesterol, Total: 148 mg/dL (ref 100–199)
HDL: 32 mg/dL — ABNORMAL LOW (ref >39)
LDL Chol Calc (NIH): 80 mg/dL (ref 0–99)
Triglycerides: 215 mg/dL — ABNORMAL HIGH (ref 0–149)
VLDL Cholesterol Cal: 36 mg/dL (ref 5–40)

## 2022-07-16 ENCOUNTER — Other Ambulatory Visit: Payer: Self-pay

## 2022-07-16 MED ORDER — METFORMIN HCL 500 MG PO TABS: 500.0000 mg | ORAL_TABLET | Freq: Two times a day (BID) | ORAL | 3 refills | Status: DC

## 2022-07-25 ENCOUNTER — Ambulatory Visit (HOSPITAL_BASED_OUTPATIENT_CLINIC_OR_DEPARTMENT_OTHER): Admitting: Internal Medicine

## 2022-07-25 ENCOUNTER — Encounter (HOSPITAL_BASED_OUTPATIENT_CLINIC_OR_DEPARTMENT_OTHER): Payer: Self-pay | Admitting: Internal Medicine

## 2022-07-25 VITALS — BP 144/82 | HR 78 | Ht 63.0 in | Wt 163.0 lb

## 2022-07-25 DIAGNOSIS — E119 Type 2 diabetes mellitus without complications: Secondary | ICD-10-CM

## 2022-07-25 DIAGNOSIS — I10 Essential (primary) hypertension: Secondary | ICD-10-CM

## 2022-07-25 DIAGNOSIS — M791 Myalgia, unspecified site: Secondary | ICD-10-CM | POA: Diagnosis not present

## 2022-07-25 DIAGNOSIS — T466X5A Adverse effect of antihyperlipidemic and antiarteriosclerotic drugs, initial encounter: Secondary | ICD-10-CM

## 2022-07-25 DIAGNOSIS — T466X5D Adverse effect of antihyperlipidemic and antiarteriosclerotic drugs, subsequent encounter: Secondary | ICD-10-CM

## 2022-07-25 DIAGNOSIS — E785 Hyperlipidemia, unspecified: Secondary | ICD-10-CM | POA: Diagnosis not present

## 2022-07-25 NOTE — Patient Instructions (Signed)
Medication Instructions:  NO CHANGES  *If you need a refill on your cardiac medications before your next appointment, please call your pharmacy*   Lab Work: FASTING lab work to check cholesterol in 6 months  If you have labs (blood work) drawn today and your tests are completely normal, you will receive your results only by: [place] (if you have MyChart) OR A paper copy in the mail If you have any lab test that is abnormal or we need to change your treatment, we will call you to review the results.    Follow-Up: At [facility], you and your health needs are our priority.  As part of our continuing mission to provide you with exceptional heart care, we have created designated Provider Care Teams.  These Care Teams include your primary Cardiologist (physician) and Advanced Practice Providers (APPs -  Physician Assistants and Nurse Practitioners) who all work together to provide you with the care you need, when you need it.  We recommend signing up for the patient portal called "MyChart".  Sign up information is provided on this After Visit Summary.  MyChart is used to connect with patients for Virtual Visits (Telemedicine).  Patients are able to view lab/test results, encounter notes, upcoming appointments, etc.  Non-urgent messages can be sent to your provider as well.   To learn more about what you can do with MyChart, go to [email or web address].    Your next appointment:   12 month(s)  The format for your next appointment:   In Person  Provider:   Lyman Bishop MD

## 2022-07-25 NOTE — Progress Notes (Signed)
Lipid Clinic Consult Note  Chief Complaint:  Follow-up dyslipidemia  Primary Care Physician: Shannon Shanks, MD (Inactive)  HPI:  Shannon Mora is a 61 y.o. female who is being seen today for the evaluation of dyslipidemia at the request of Shannon Shanks, MD.  This is a pleasant 61 year old female with type 2 diabetes followed by Shannon Mora in endocrinology.  She has been noted to have difficult to control dyslipidemia with primarily elevated triglycerides as well as high LDL.  Her last direct LDL 10 days ago was 166.  Total cholesterol was 233, triglycerides 378 and HDL of 38.5.  Non-HDL cholesterol was 194 (approximately 30 points higher as expected then LDL cholesterol, suggesting concordance).  Unfortunately, Shannon Mora has had difficulty with medications.  She has had side effects with multiple statin medications including atorvastatin, rosuvastatin, simvastatin, ezetimibe, and alternatives such as the fibrates.  Her A1c is well controlled at 6.4.  She is also not an alcohol user.  There is family history of early onset heart disease in her father.  Really she is asymptomatic, denying any chest pain, worsening shortness of breath or associated symptoms.  Her statin side effects and not only included myalgias but significant back pain and joint stiffness.  She also had symptoms with fenofibrate and cholestyramine.  She did get some improvement in her LDL cholesterol on rosuvastatin, but again it was not tolerated.  02/21/2019  Shannon Mora is seen here for follow-up.  She had a marked dyslipidemia as above and underwent CT coronary calcium scoring.  Fortunately showed 0 coronary calcium score.  This is associated with low risk of events.  We had recommended pursuing PCSK9 inhibitor therapy given her intolerance to statins however this was declined due to lack of ASCVD.  Based on this, elected to pursue therapy with a newly available Nexletol.  She has been taking this now for a month and seems  to be tolerating it well.  Although it is only moderate potency, this should be associated with some improvement in her symptoms.  Optionally, we may be able to add ezetimibe to this therapy.  She apparently had had side effects to this however it may have been in combination with simvastatin and the statin is certainly more likely to cause her side effects.  Also today, she noted that blood pressures been elevated.  Although we have been seeing her for dyslipidemia, her PCP requested that we evaluated her for hypertension.  I reviewed blood pressures over the last couple of years which showed generally blood pressures between 601-093 systolic.  As a diabetic, she would benefit for them renal protection with ACE or ARB.  10/31/2019  Shannon Mora returns today for follow-up.  She has persistently elevated triglycerides now 273.  Total cholesterol 177, HDL 36 and LDL at target at 95.  This is based on a 0 calcium score therefore I felt that we did not need to be quite as aggressive with her risk modification.  Her diabetes has been very well controlled.  She sees endocrinology and her A1c was somewhere around 6.2.  We talked about options for lowering her triglycerides, including the fibrate and or Vascepa.  There is little evidence for risk reduction with a fibrate however as diabetic with at least 2 risk factors, she could qualify for Vascepa.  Despite this, she is not interested in additional medications at this time.  She wants to work on more physical activity and weight loss.   05/01/2020  Shannon Mora  is seen today in follow-up.  Its been now 6-monthsince we last saw each other.  She was planning to work on diet and make other adjustments in her lifestyle and activities.  Unfortunately her lipids are not much better.  LDL well it was at target of 95 is now 150, total cholesterol 233, HDL 36 and triglycerides 284.  She is compliant with the Nexletol.  She said diet has not been as ideal and her exercise levels  are less.  08/02/2020  Ms. BBatteyis seen today for follow-up.  She reports be more active and is now working part-time job.  She has made some dietary changes and is using generic icosapent ethyl in addition to her bempedoic acid.  The combination of medications has helped her cholesterol with improvement in her triglycerides and a small improvement in LDL.  Her total cholesterol has come down to 210 and LDL is 134.  Triglycerides are still elevated 206 but improved from previous at 284.  11/06/2020  Ms. BFullenwideris seen today in follow-up.  Her lipids are remarkably better.  Her total cholesterol is now down to 143 from 210, triglycerides 230, HDL 36 and LDL 69 down from 134.  This is with the recent addition of pravastatin to her Nexletol.  She remains also on Vascepa.  Overall I think it is a good combination of medications.  Cost is the only other outstanding issue with these.  We may be able to get the cost of her Vascepa down.  05/06/2021  Shannon Mora seen today for follow-up.  Her cholesterol has trended up a little however still within the target range.  Her total cholesterol now is 157, HDL 32, triglycerides 237 and LDL 85.  She is tolerating the medications and they are affordable for her.  She reports regular physical exercise walking about 2 and half miles every day.  Her A1c is come down significantly to 6.4%.  She has follow-up with her endocrinologist in December.  07/25/2022  Shannon Mora seen today in follow-up.  She has had a small increase in her hemoglobin A1c.  She said this may have been due to some traveling she did over the summer.  Her triglycerides are up somewhat to 215 but LDL is down to 80, total cholesterol 148 and HDL 32.  Her LDL about 8 months ago was 127.  She remains on pravastatin 40 mg daily and Vascepa 2 g twice daily.  PMHx:  Past Medical History:  Diagnosis Date   Basal cell cancer    On forehead.   GERD (gastroesophageal reflux disease)    HSV-1 (herpes  simplex virus 1) infection    Hyperglycemia    Hyperlipidemia    Perimenopausal    Vitamin D deficiency      FAMHx:  Family History  Problem Relation Age of Onset   Heart disease Father     SOCHx:   reports that she has never smoked. She has never used smokeless tobacco. She reports current alcohol use of about 1.0 standard drink of alcohol per week. She reports that she does not use drugs.  ALLERGIES:  Allergies  Allergen Reactions   Statins Other (See Comments)    Atorvastatin, rosuvastatin, simvastatin, ezetimibe    ROS: Pertinent items noted in HPI and remainder of comprehensive ROS otherwise negative.  HOME MEDS: Current Outpatient Medications on File Prior to Visit  Medication Sig Dispense Refill   Cholecalciferol (VITAMIN D3) 2000 units TABS Take by mouth.  glucose blood (FREESTYLE LITE) test strip Use to check blood sugars one time daily 100 each 4   hydrochlorothiazide (MICROZIDE) 12.5 MG capsule TAKE 1 CAPSULE(12.5 MG) BY MOUTH DAILY 90 capsule 3   icosapent Ethyl (VASCEPA) 1 g capsule TAKE 2 CAPSULES TWICE A DAY 360 capsule 3   Lancets (FREESTYLE) lancets Use to check blood sugars one time daily 100 each 4   Magnesium 500 MG TABS Take 1 tablet by mouth every morning.     meloxicam (MOBIC) 15 MG tablet Take 15 mg by mouth as needed.     metFORMIN (GLUCOPHAGE) 500 MG tablet Take 1 tablet (500 mg total) by mouth 2 (two) times daily with a meal. 180 tablet 3   Multiple Vitamins-Minerals (EYE VITAMINS PO) Take 1 capsule by mouth daily.     NEXLETOL 180 MG TABS TAKE 1 TABLET DAILY 90 tablet 3   pravastatin (PRAVACHOL) 40 MG tablet Take 1 tablet (40 mg total) by mouth every evening. (Patient taking differently: Take 20 mg by mouth every evening.) 90 tablet 3   telmisartan (MICARDIS) 40 MG tablet TAKE 1 TABLET DAILY 90 tablet 3   No current facility-administered medications on file prior to visit.    LABS/IMAGING: No results found for this or any previous visit  (from the past 48 hour(s)). No results found.  LIPID PANEL:    Component Value Date/Time   CHOL 148 07/14/2022 1000   TRIG 215 (H) 07/14/2022 1000   TRIG 246 (H) 12/02/2013 1239   HDL 32 (L) 07/14/2022 1000   HDL 45 12/02/2013 1239   CHOLHDL 4.6 (H) 07/14/2022 1000   CHOLHDL 6 06/07/2018 0819   VLDL 75.6 (H) 06/07/2018 0819   LDLCALC 80 07/14/2022 1000   LDLCALC 138 (H) 12/02/2013 1239   LDLDIRECT 150 (H) 04/24/2020 0949   LDLDIRECT 166.0 06/07/2018 0819    WEIGHTS: Wt Readings from Last 3 Encounters:  07/25/22 163 lb (73.9 kg)  05/20/22 162 lb (73.5 kg)  11/15/21 162 lb 12.8 oz (73.8 kg)    VITALS: BP (!) 144/82 (BP Location: Right Arm, Patient Position: Sitting, Cuff Size: Normal)   Pulse 78   Ht '5\' 3"'$  (1.6 m)   Wt 163 lb (73.9 kg)   LMP 10/07/2013   BMI 28.87 kg/m   EXAM: Deferred  EKG: Deferred  ASSESSMENT: Mixed dyslipidemia (high LDL, triglycerides and low HDL), goal LDL <70 Type 2 diabetes-A1c 7.1% Family history of premature coronary disease - Zero (0) CAC Statin intolerance - myalgias Essential hypertension  PLAN: 1.   Shannon Mora is a lower risk diabetic technically should have a target LDL less than 70.  She has been around 80 with elevated triglycerides but there is been a recent increase in A1c.  She would like to be more aggressive with diet and activity to try to meet her targets.  Will plan repeat lipids in 6 months.  If that is not achieved we could consider either increasing her pravastatin to 80 mg daily or adding ezetimibe 10 mg daily.  Pixie Casino, MD, [facility], [place] Director of the [facility] Lipid Disorders &  Cardiovascular Risk Reduction Clinic Diplomate of the American Board of Clinical Lipidology Attending Cardiologist  Direct Dial: [phone]  Fax: [phone]  Website:  [email or web address] 07/25/2022, 9:26 AM

## 2022-09-02 ENCOUNTER — Ambulatory Visit (HOSPITAL_BASED_OUTPATIENT_CLINIC_OR_DEPARTMENT_OTHER): Admitting: Internal Medicine

## 2022-09-12 ENCOUNTER — Other Ambulatory Visit: Payer: Self-pay

## 2022-09-15 ENCOUNTER — Encounter (HOSPITAL_BASED_OUTPATIENT_CLINIC_OR_DEPARTMENT_OTHER): Payer: Self-pay | Admitting: Internal Medicine

## 2022-09-15 MED ORDER — PRAVASTATIN SODIUM 40 MG PO TABS: 40.0000 mg | ORAL_TABLET | Freq: Every evening | ORAL | 3 refills | Status: DC

## 2022-09-15 NOTE — Telephone Encounter (Signed)
Rx(s) sent to pharmacy electronically.  

## 2022-09-16 MED ORDER — PRAVASTATIN SODIUM 20 MG PO TABS: 20.0000 mg | ORAL_TABLET | Freq: Every evening | ORAL | 3 refills | Status: DC

## 2022-09-16 NOTE — Telephone Encounter (Signed)
Spoke with patient who stated she wanted to let Dr. Debara Pickett know that she has never been on pravastatin '40mg'$ . She has always taken pravastatin '20mg'$  daily. She needs a refill on the medication. Shall I order the '20mg'$  dose?

## 2022-09-23 ENCOUNTER — Ambulatory Visit: Admitting: Internal Medicine

## 2022-09-23 MED ORDER — PRAVASTATIN SODIUM 20 MG PO TABS: 20.0000 mg | ORAL_TABLET | Freq: Every evening | ORAL | 3 refills | Status: DC

## 2022-09-23 NOTE — Telephone Encounter (Signed)
Patient wrote in MyChart that [facility] has not received pravastatin '20mg'$  daily. Called [facility]); told me patient's coverage with [employer] is under [employer] [phone]). Spoke with Marnell at [employer] who transferred me to pharmacist Remo Lipps. She's stated there was prescription sent in to [facility] on 1/22 for '40mg'$  tablet. She said to resubmit prescription and stated '40mg'$  at [facility] in error. (Done).

## 2022-09-25 ENCOUNTER — Other Ambulatory Visit: Payer: Self-pay | Admitting: Internal Medicine

## 2022-11-03 ENCOUNTER — Ambulatory Visit: Admitting: Internal Medicine

## 2022-11-03 ENCOUNTER — Encounter: Payer: Self-pay | Admitting: Internal Medicine

## 2022-11-03 VITALS — BP 128/82 | HR 79 | Ht 63.0 in | Wt 160.4 lb

## 2022-11-03 DIAGNOSIS — E785 Hyperlipidemia, unspecified: Secondary | ICD-10-CM | POA: Diagnosis not present

## 2022-11-03 DIAGNOSIS — E119 Type 2 diabetes mellitus without complications: Secondary | ICD-10-CM

## 2022-11-03 DIAGNOSIS — E663 Overweight: Secondary | ICD-10-CM

## 2022-11-03 LAB — POCT GLYCOSYLATED HEMOGLOBIN (HGB A1C): Hemoglobin A1C: 6.9 % — AB (ref 4.0–5.6)

## 2022-11-03 MED ORDER — FREESTYLE LANCETS MISC: 3 refills | Status: AC

## 2022-11-03 MED ORDER — FREESTYLE LITE TEST VI STRP: ORAL_STRIP | 3 refills | Status: DC

## 2022-11-03 NOTE — Patient Instructions (Addendum)
Please continue:  - Metformin 500 mg 2x with meals  Please return in 4 months with your sugar log.

## 2022-11-03 NOTE — Progress Notes (Signed)
Patient ID: Shannon Mora, female   DOB: 11-14-1960, 62 y.o.   MRN: [id number]  HPI: Shannon Mora is a 62 y.o.-year-old female, returning for follow-up for DM2, dx in 2015, prev. GDM 1989, 1992, non-insulin-dependent, controlled, without long-term complications and also hyperlipidemia.  Last visit 6 months ago.  Interim history: No increased urination, blurry vision, nausea, chest pain. She had an episode of dizziness approximately 1 month ago.  She did not check her blood sugar, her blood pressure and went to bed and it resolved.  However, this was a health scare and afterwards she started to improve her diet by reducing salt and starting to walk daily.  Blood sugars improved.  Also, she lost 2 pounds.  DM2: Reviewed HbA1c levels: Lab Results  Component Value Date   HGBA1C 7.1 (A) 05/20/2022   HGBA1C 6.9 (A) 11/15/2021   HGBA1C 7.2 (A) 02/14/2021  07/07/2017: HbA1c 6.8% 03/03/2017: HbA1c 6.5% 10/30/2016: HbA1c 6.6%  Patient is on: - Metformin 500 mg in a.m. >> at night >> 2x a day  She is checking her sugars once a day: - am: 116-130 >> 85, 112-131 >> 112-137 >> 113-133, 146 >> 112-134 - 2h after b'fast: 101 >> n/c >> 120 >> n/c >> 115 >> 143 >> 95, 103-126 - before lunch: 103 >> 94-130 >> 98, 111 >> 119 >> 88, 95 - 2h after lunch: 124 >> 114 >> n/c >> 111, 125 >> 119 >> 86-108 - before dinner: 88-99 >> 113, 115 >> 86, 97 >> 121 >> 88 - 2h after dinner: 122 >> n/c >> 103, 121 >> 150, 200 >> 109 - bedtime: n/c >> 146 >> n/c >> 121, 168 >> n/c >> 113 - nighttime: n/c Lowest sugar was 83 >> 103 >> 86 >> 113>> 86; it is unclear at which level she has hypoglycemia awareness. Highest sugar was 114 >> 130 >> 131 >> 200 (rice) >> 134.  Glucometer: AccuCheck  Pt's meals are: - Breakfast: egg white, vegan links, coffee - Lunch: protein shake >> salad + [place] breast (1/4 cup) - Dinner: vegetarian dish or fish/poultry - Snacks: 1-2  -No CKD, last BUN/creatinine:  Lab Results   Component Value Date   BUN 23 11/15/2021   BUN 22 08/09/2020   CREATININE 0.83 11/15/2021   CREATININE 0.72 08/09/2020  07/07/2017: Glucose 108, BUN/creatinine 14/0.75 She is on telmisartan.  - last eye exam was 03/20/2022: No DR reportedly, + macular degeneration (pseudovitelliform dystrophy). Dr. Lady Gary and Dr. Zigmund Daniel.  -No numbness and tingling in her feet.  Last foot exam 11/15/2021.  Pt has FH of DM in brother.  Hyperlipidemia: -+ Familial hyperlipidemia  Reviewed latest lipid panel: Lab Results  Component Value Date   CHOL 148 07/14/2022   CHOL 191 11/20/2021   CHOL 157 05/01/2021   Lab Results  Component Value Date   HDL 32 (L) 07/14/2022   HDL 39 (L) 11/20/2021   HDL 32 (L) 05/01/2021   Lab Results  Component Value Date   LDLCALC 80 07/14/2022   LDLCALC 127 (H) 11/20/2021   LDLCALC 85 05/01/2021   Lab Results  Component Value Date   TRIG 215 (H) 07/14/2022   TRIG 139 11/20/2021   TRIG 237 (H) 05/01/2021   Lab Results  Component Value Date   CHOLHDL 4.6 (H) 07/14/2022   CHOLHDL 4.9 (H) 11/20/2021   CHOLHDL 4.9 (H) 05/01/2021   Lab Results  Component Value Date   LDLDIRECT 150 (H) 04/24/2020   LDLDIRECT 105 (H) 10/28/2019  LDLDIRECT 104 (H) 08/24/2019  07/07/2017: 264/167/43/187 03/03/2017: 239/158/43/165  10/30/2016: 128/149/47/51 06/30/2016: 237/308/41/134 We tried fluvastatin XL (after preauthorization).  She could not tolerate this due to muscle aches and joint aches even when she was taking it every 3 days. She was on ezetimibe >> mm pain She is intolerant to statins: tried Lipitor, low-dose Crestor >> mm pain.  However, Crestor worked great for her >> LDL decreased to 51. She retried this 2020 >> joint pain She tried Fenofibrate and also Cholestyramine >> did not work. I referred her to the lipid clinic-she was started on Bempedoic acid as a PCSK9 inhibitor was not covered by [employer]. She started on generic Vascepa 2g 2x a day, also on  CoQ10  Her father died in his 80s from heart disease.  She has multiple relatives on father side with hyperlipidemia and heart disease.  ROS: + see HPI  I reviewed pt's medications, allergies, PMH, social hx, family hx, and changes were documented in the history of present illness. Otherwise, unchanged from my initial visit note.  Surgeries: - tubal ligation sx 1992 - gall bladder sx - low lumbar sx  Past Medical History:  Diagnosis Date   Basal cell cancer    On forehead.   GERD (gastroesophageal reflux disease)    HSV-1 (herpes simplex virus 1) infection    Hyperglycemia    Hyperlipidemia    Perimenopausal    Vitamin D deficiency    Social History   Socioeconomic History   Marital status: Married    Spouse name: Not on file   Number of children: 2  Social Needs  Occupational History   Not on file  Tobacco Use   Smoking status: Never Smoker   Smokeless tobacco: Never Used  Substance and Sexual Activity   Alcohol use: Yes    Alcohol/week: 0.6 oz    Types: 1 Glasses of hard cider per mo    Comment: monthly   Drug use: No   Current Outpatient Medications on File Prior to Visit  Medication Sig Dispense Refill   Cholecalciferol (VITAMIN D3) 2000 units TABS Take by mouth.     glucose blood (FREESTYLE LITE) test strip Use to check blood sugars one time daily 100 each 4   hydrochlorothiazide (MICROZIDE) 12.5 MG capsule TAKE 1 CAPSULE(12.5 MG) BY MOUTH DAILY 90 capsule 3   icosapent Ethyl (VASCEPA) 1 g capsule TAKE 2 CAPSULES TWICE A DAY 360 capsule 3   Lancets (FREESTYLE) lancets Use to check blood sugars one time daily 100 each 4   Magnesium 500 MG TABS Take 1 tablet by mouth every morning.     meloxicam (MOBIC) 15 MG tablet Take 15 mg by mouth as needed.     metFORMIN (GLUCOPHAGE) 500 MG tablet Take 1 tablet (500 mg total) by mouth 2 (two) times daily with a meal. 180 tablet 3   Multiple Vitamins-Minerals (EYE VITAMINS PO) Take 1 capsule by mouth daily.     NEXLETOL  180 MG TABS TAKE 1 TABLET DAILY 90 tablet 3   pravastatin (PRAVACHOL) 20 MG tablet TAKE 1 TABLET(20 MG) BY MOUTH EVERY EVENING 90 tablet 3   telmisartan (MICARDIS) 40 MG tablet TAKE 1 TABLET DAILY 90 tablet 3   No current facility-administered medications on file prior to visit.   Allergies  Allergen Reactions   Statins Other (See Comments)    Atorvastatin, rosuvastatin, simvastatin, ezetimibe   Family History  Problem Relation Age of Onset   Heart disease Father     PE:  BP 128/82 (BP Location: Right Arm, Patient Position: Sitting, Cuff Size: Normal)   Pulse 79   Ht '5\' 3"'$  (1.6 m)   Wt 160 lb 6.4 oz (72.8 kg)   LMP 10/07/2013   SpO2 96%   BMI 28.41 kg/m  Wt Readings from Last 3 Encounters:  11/03/22 160 lb 6.4 oz (72.8 kg)  07/25/22 163 lb (73.9 kg)  05/20/22 162 lb (73.5 kg)   Constitutional: Slightly overweight, in NAD Eyes: EOMI, no exophthalmos ENT: no thyromegaly, no cervical lymphadenopathy Cardiovascular: RRR, No MRG Respiratory: CTA B Musculoskeletal: no deformities Skin: no rashes Neurological: no tremor with outstretched hands Diabetic Foot Exam - Simple   Simple Foot Form Diabetic Foot exam was performed with the following findings: Yes 11/03/2022  9:46 AM  Visual Inspection No deformities, no ulcerations, no other skin breakdown bilaterally: Yes Sensation Testing Intact to touch and monofilament testing bilaterally: Yes Pulse Check Posterior Tibialis and Dorsalis pulse intact bilaterally: Yes Comments    ASSESSMENT: 1. DM2, non-insulin-dependent, controlled, without long term complications  2. HL  3.  Overweight  PLAN:  1. Patient with history of controlled type 2 diabetes, on metformin only, which we increased at last visit.  At that time, HbA1c was higher, at 7.1%.  Sugars appears to be higher than before later in the day but mostly at target in the morning with only occasional slightly higher values.  She was working on improving her diet. -At  today's visit, sugars appear to be very well-controlled in the last 3 to 4 weeks, after she started to improve diet and walk consistently.  No need to change her regimen.  I strongly advised her to continue.  However, I did advise her that if she has another episode of dizziness, to check blood pressure and blood sugars and she may need to go to the hospital if they are abnormal. - I suggested to:  Patient Instructions  Please increase:  - Metformin 500 mg 2x with meals  Please return in 4 months with your sugar log.   - we checked her HbA1c: 6.9% (lower) - advised to check sugars at different times of the day - 1x a day, rotating check times - advised for yearly eye exams >> she is UTD - return to clinic in 3-4 months  2. HL -Patient with familial hyperlipidemia, with history of early heart disease in her father, in his early 65s -She has previous intolerances to Lipitor and Crestor (muscle cramps), even at low doses, despite improvement in her lipid panels.  She could not tolerate fluvastatin XL and pravastatin due to muscle cramps. -She tried fenofibrate and bile acid sequestrant's but these did not work well for her -Reviewed latest lipid panel from 06/2022: LDL significantly improved from 2021: HDL slightly low, triglycerides high: Lab Results  Component Value Date   CHOL 148 07/14/2022   HDL 32 (L) 07/14/2022   LDLCALC 80 07/14/2022   LDLDIRECT 150 (H) 04/24/2020   TRIG 215 (H) 07/14/2022   CHOLHDL 4.6 (H) 07/14/2022  -She is now seen in the lipid clinic.  She was started on Bempedoic acid as [facility] refused to cover a PCSK9 inhibitor for her.  She is also on Vascepa 2 g twice a day, tolerating well.  3.  Overweight -In the past, she did well on a modified plant-based diet when she came off the diet -She is now walking consistently and works on improving diet. -Weight was stable at last 2 visits, now lost 3 pounds  Philemon Kingdom, MD PhD [facility] Endocrinology

## 2022-12-01 ENCOUNTER — Other Ambulatory Visit (HOSPITAL_COMMUNITY)
Admission: RE | Admit: 2022-12-01 | Discharge: 2022-12-01 | Disposition: A | Discharge status: Home / Self Care | Source: Ambulatory Visit | Attending: Family Medicine | Admitting: Family Medicine

## 2022-12-01 DIAGNOSIS — Z01411 Encounter for gynecological examination (general) (routine) with abnormal findings: Secondary | ICD-10-CM | POA: Insufficient documentation

## 2022-12-05 LAB — CYTOLOGY - PAP
Comment: NEGATIVE
Diagnosis: NEGATIVE
High risk HPV: NEGATIVE

## 2022-12-23 ENCOUNTER — Other Ambulatory Visit: Payer: Self-pay | Admitting: Internal Medicine

## 2023-01-05 ENCOUNTER — Other Ambulatory Visit: Payer: Self-pay | Admitting: Internal Medicine

## 2023-01-05 DIAGNOSIS — I10 Essential (primary) hypertension: Secondary | ICD-10-CM

## 2023-01-12 ENCOUNTER — Other Ambulatory Visit: Payer: Self-pay | Admitting: Internal Medicine

## 2023-02-27 ENCOUNTER — Encounter (HOSPITAL_BASED_OUTPATIENT_CLINIC_OR_DEPARTMENT_OTHER): Payer: Self-pay | Admitting: Internal Medicine

## 2023-03-02 MED ORDER — TELMISARTAN 40 MG PO TABS: 80.0000 mg | ORAL_TABLET | Freq: Every day | ORAL | 0 refills | Status: DC

## 2023-03-02 MED ORDER — TELMISARTAN 80 MG PO TABS: 80.0000 mg | ORAL_TABLET | Freq: Every day | ORAL | 3 refills | Status: DC

## 2023-03-05 ENCOUNTER — Ambulatory Visit: Admitting: Internal Medicine

## 2023-03-05 ENCOUNTER — Other Ambulatory Visit: Payer: Self-pay | Admitting: Internal Medicine

## 2023-03-05 ENCOUNTER — Encounter: Payer: Self-pay | Admitting: Internal Medicine

## 2023-03-05 VITALS — BP 118/70 | HR 78 | Ht 63.0 in | Wt 153.4 lb

## 2023-03-05 DIAGNOSIS — E119 Type 2 diabetes mellitus without complications: Secondary | ICD-10-CM | POA: Diagnosis not present

## 2023-03-05 DIAGNOSIS — E785 Hyperlipidemia, unspecified: Secondary | ICD-10-CM

## 2023-03-05 DIAGNOSIS — Z7984 Long term (current) use of oral hypoglycemic drugs: Secondary | ICD-10-CM | POA: Diagnosis not present

## 2023-03-05 DIAGNOSIS — E663 Overweight: Secondary | ICD-10-CM | POA: Diagnosis not present

## 2023-03-05 LAB — HEMOGLOBIN A1C: Hemoglobin A1C: 6.8

## 2023-03-05 MED ORDER — METFORMIN HCL 500 MG PO TABS: 500.0000 mg | ORAL_TABLET | Freq: Two times a day (BID) | ORAL | 3 refills | Status: DC

## 2023-03-05 NOTE — Patient Instructions (Addendum)
Please continue:  - Metformin 500 mg 2x with meals  Please return in 4-6 months with your sugar log.

## 2023-03-05 NOTE — Progress Notes (Signed)
Patient ID: Shannon Mora, female   DOB: 1961-03-05, 62 y.o.   MRN: [id number]  HPI: Shannon Mora is a 62 y.o.-year-old female, returning for follow-up for DM2, dx in 2015, prev. GDM 1989, 1992, non-insulin-dependent, controlled, without long-term complications and also hyperlipidemia.  Last visit 61 months ago.  Interim history: No increased urination, blurry vision, nausea, chest pain. She had another episode of dizziness this week -resolved She has fluctuating blood pressure and her Micardis dose recently increased.  DM2: Reviewed HbA1c levels: Lab Results  Component Value Date   HGBA1C 6.9 (A) 11/03/2022   HGBA1C 7.1 (A) 05/20/2022   HGBA1C 6.9 (A) 11/15/2021  07/07/2017: HbA1c 6.8% 03/03/2017: HbA1c 6.5% 10/30/2016: HbA1c 6.6%  Patient is on: - Metformin 500 mg in a.m. >> at night >> 2x a day  She is checking her sugars once a day: - am: 85, 112-131 >> 112-137 >> 113-133, 146 >> 112-134 >> 114-135, 142 - 2h after b'fast: 120 >> n/c >> 115 >> 143 >> 95, 103-126 >> 112 - before lunch: 103 >> 94-130 >> 98, 111 >> 119 >> 88, 95 >> 85 - 2h after lunch: 114 >> n/c >> 111, 125 >> 119 >> 86-108 >> 122 - before dinner: 88-99 >> 113, 115 >> 86, 97 >> 121 >> 88 >> 124 - 2h after dinner: 122 >> n/c >> 103, 121 >> 150, 200 >> 109 >> n/c - bedtime: n/c >> 146 >> n/c >> 121, 168 >> n/c >> 113 >> n/c - nighttime: n/c Lowest sugar was 113 >> 86 >> 85; it is unclear at which level she has hypoglycemia awareness. Highest sugar was 200 (rice) >> 134 >> 142  Glucometer: AccuCheck  Pt's meals are: - Breakfast: egg white, vegan links, coffee - Lunch: protein shake >> salad + [place] breast (1/4 cup) - Dinner: vegetarian dish or fish/poultry - Snacks: 1-2  -No CKD, last BUN/creatinine:  Comp Metabolic Panel Reviewed date:12/03/2022 07:41:59 AM Interpretation:normal Performing Lab: Notes/Report: Testing Performed at: [facility], [address], [address], [place], [place] [address]   Glucose 93 70-99 mg/dL  BUN 21 1-32 mg/dL  Creatinine 4.40 1.02-7.25 mg/dl  [id number] 89 >74 calc  Sodium 140 136-145 mmol/L  Potassium 3.8 3.5-5.5 mmol/L  Chloride 100 98-107 mmol/L  CO2 31 22-32 mmol/L  Anion Gap 13.2 6.0-20.0 mmol/L  Calcium 10.8 8.6-10.3 mg/dL  CA-corrected 25.95 6.38-75.64 mg/dL  Protein, Total 7.4 3.3-2.9 g/dL  Albumin 4.8 5.1-8.8 g/dL  TBIL 0.4 4.1-6.6 mg/dL  ALP 47 06-301 U/L  AST 24 0-39 U/L  ALT 20 0-52 U/L  Microalbumin Panel Reviewed date:12/03/2022 07:42:28 AM Interpretation:normal Performing Lab: Notes/Report: Testing Performed at: [facility], [address], [place], [place] [address]  MA/CR ratio <9.7 0.0-30.0 mg/G  UMA <0.7    UCR 72     Lab Results  Component Value Date   BUN 23 11/15/2021   BUN 22 08/09/2020   CREATININE 0.83 11/15/2021   CREATININE 0.72 08/09/2020  07/07/2017: Glucose 108, BUN/creatinine 14/0.75 She is on telmisartan.  - last eye exam was 03/20/2022: No DR reportedly, + macular degeneration (pseudovitelliform dystrophy). Dr. Tama High (will retire) and Dr. Ashley Royalty.  -No numbness and tingling in her feet.  Last foot exam 11/03/2022.  Pt has FH of DM in brother.  Hyperlipidemia: -+ Familial hyperlipidemia  Reviewed latest lipid panel: Lab Results  Component Value Date   CHOL 148 07/14/2022   CHOL 191 11/20/2021   CHOL 157 05/01/2021   Lab Results  Component Value  Date   HDL 32 (L) 07/14/2022   HDL 39 (L) 11/20/2021   HDL 32 (L) 05/01/2021   Lab Results  Component Value Date   LDLCALC 80 07/14/2022   LDLCALC 127 (H) 11/20/2021   LDLCALC 85 05/01/2021   Lab Results  Component Value Date   TRIG 215 (H) 07/14/2022   TRIG 139 11/20/2021   TRIG 237 (H) 05/01/2021   Lab Results  Component Value Date   CHOLHDL 4.6 (H) 07/14/2022   CHOLHDL 4.9 (H) 11/20/2021   CHOLHDL 4.9 (H) 05/01/2021   Lab Results  Component Value Date   LDLDIRECT 150 (H) 04/24/2020   LDLDIRECT 105 (H) 10/28/2019    LDLDIRECT 104 (H) 08/24/2019  07/07/2017: 264/167/43/187 03/03/2017: 239/158/43/165  10/30/2016: 128/149/47/51 06/30/2016: 237/308/41/134 We tried fluvastatin XL (after preauthorization).  She could not tolerate this due to muscle aches and joint aches even when she was taking it every 3 days. She was on ezetimibe >> mm pain She is intolerant to statins: tried Lipitor, low-dose Crestor >> mm pain.  However, Crestor worked great for her >> LDL decreased to 51. She retried this 2020 >> joint pain She tried Fenofibrate and also Cholestyramine >> did not work. I referred her to the lipid clinic-she was started on Bempedoic acid as a PCSK9 inhibitor was not covered by [employer]. She started on generic Vascepa 2g 2x a day, also on CoQ10. She is currently tried on pravastatin again, at 20 mg daily. She tolerates thi well.  Her father died in his 43s from heart disease.  She has multiple relatives on father side with hyperlipidemia and heart disease.  ROS: + see HPI  I reviewed pt's medications, allergies, PMH, social hx, family hx, and changes were documented in the history of present illness. Otherwise, unchanged from my initial visit note.  Surgeries: - tubal ligation sx 1992 - gall bladder sx - low lumbar sx  Past Medical History:  Diagnosis Date   Basal cell cancer    On forehead.   GERD (gastroesophageal reflux disease)    HSV-1 (herpes simplex virus 1) infection    Hyperglycemia    Hyperlipidemia    Perimenopausal    Vitamin D deficiency    Social History   Socioeconomic History   Marital status: Married    Spouse name: Not on file   Number of children: 2  Social Needs  Occupational History   Not on file  Tobacco Use   Smoking status: Never Smoker   Smokeless tobacco: Never Used  Substance and Sexual Activity   Alcohol use: Yes    Alcohol/week: 0.6 oz    Types: 1 Glasses of hard cider per mo    Comment: monthly   Drug use: No   Current Outpatient Medications on File  Prior to Visit  Medication Sig Dispense Refill   Cholecalciferol (VITAMIN D3) 2000 units TABS Take by mouth.     glucose blood (FREESTYLE LITE) test strip Use to check blood sugars 1-2x daily 200 each 3   hydrochlorothiazide (MICROZIDE) 12.5 MG capsule TAKE 1 CAPSULE DAILY 90 capsule 1   icosapent Ethyl (VASCEPA) 1 g capsule TAKE 2 CAPSULES TWICE A DAY 360 capsule 3   Lancets (FREESTYLE) lancets Use to check blood sugars 1-2x daily 200 each 3   Magnesium 500 MG TABS Take 1 tablet by mouth every morning.     meloxicam (MOBIC) 15 MG tablet Take 15 mg by mouth as needed.     metFORMIN (GLUCOPHAGE) 500 MG tablet Take 1  tablet (500 mg total) by mouth 2 (two) times daily with a meal. 180 tablet 3   Multiple Vitamins-Minerals (EYE VITAMINS PO) Take 1 capsule by mouth daily.     NEXLETOL 180 MG TABS TAKE 1 TABLET DAILY 90 tablet 3   pravastatin (PRAVACHOL) 20 MG tablet TAKE 1 TABLET(20 MG) BY MOUTH EVERY EVENING 90 tablet 3   telmisartan (MICARDIS) 80 MG tablet Take 1 tablet (80 mg total) by mouth daily. 90 tablet 3   No current facility-administered medications on file prior to visit.   Allergies  Allergen Reactions   Statins Other (See Comments)    Atorvastatin, rosuvastatin, simvastatin, ezetimibe   Family History  Problem Relation Age of Onset   Heart disease Father    PE: BP 118/70   Pulse 78   Ht 5\' 3"  (1.6 m)   Wt 153 lb 6.4 oz (69.6 kg)   LMP 10/07/2013   SpO2 99%   BMI 27.17 kg/m  Wt Readings from Last 3 Encounters:  03/05/23 153 lb 6.4 oz (69.6 kg)  11/03/22 160 lb 6.4 oz (72.8 kg)  07/25/22 163 lb (73.9 kg)   Constitutional: Slightly overweight, in NAD Eyes: EOMI, no exophthalmos ENT: no thyromegaly, no cervical lymphadenopathy Cardiovascular: RRR, No MRG Respiratory: CTA B Musculoskeletal: no deformities Skin: no rashes Neurological: no tremor with outstretched hands  ASSESSMENT: 1. DM2, non-insulin-dependent, controlled, without long term complications  2.  HL  3.  Overweight  PLAN:  1. Patient with history of controlled type 2 diabetes, on metformin only, with an HbA1c of 6.9% at last visit, improved.  Sugars appear to be well-controlled in the previous 3 to 4 weeks at that time, as she was with an on improving diet and walking consistently.  We did not change her regimen. -At today's visit, sugars remain at goal, with only few mildly hyperglycemic exceptions.  No need to change her regimen for now.  She continues to be diligent with her diet and trying to build up again her walking.  Especially in the setting of her recent dizziness episode, we discussed about the importance of staying well-hydrated, as she is also on a diuretic. - I suggested to:  Patient Instructions  Please increase:  - Metformin 500 mg 2x with meals  Please return in 4 months with your sugar log.   - we checked her HbA1c: 6.8% (lower) - advised to check sugars at different times of the day - 1x a day, rotating check times - advised for yearly eye exams >> she is UTD - return to clinic in 4 months  2. HL -Patient has familial hyperlipidemia, with history of early heart disease, in her father, in his early 62s -She is currently seen in the lipid clinic.  She did not tolerate Lipitor, Crestor, pravastatin, and fluvastatin in the past due to muscle cramps.  Also, she tried fenofibrate and bile acid sequestrant but this did not work well for her.  She was started on Bempedoic acid as her insurance did not cover a PCSK9 inhibitor for her.  She is also on Vascepa 2 g twice a day, tolerated well. -Reviewed latest lipid panel from 06/2022: LDL much improved, triglycerides high, HDL low: Lab Results  Component Value Date   CHOL 148 07/14/2022   HDL 32 (L) 07/14/2022   LDLCALC 80 07/14/2022   LDLDIRECT 150 (H) 04/24/2020   TRIG 215 (H) 07/14/2022   CHOLHDL 4.6 (H) 07/14/2022  -Continues to see the Lipid clinic -currently also on pravastatin 20  mg daily, tolerated well.  3.   Overweight -In the past, she did well on a modified plant-based diet when she came off the diet -At last visit she was walking consistently and working on improving diet -lost 3 pounds -She continues paying close attention to her diet -she lost 7 pounds since last visit -Will continue metformin which should also help reducing appetite   Carlus Pavlov, MD PhD [facility]

## 2023-07-15 ENCOUNTER — Other Ambulatory Visit: Payer: Self-pay | Admitting: Internal Medicine

## 2023-07-15 DIAGNOSIS — E785 Hyperlipidemia, unspecified: Secondary | ICD-10-CM

## 2023-07-21 LAB — LIPID PANEL
Chol/HDL Ratio: 4 {ratio} (ref 0.0–4.4)
Cholesterol, Total: 136 mg/dL (ref 100–199)
HDL: 34 mg/dL — ABNORMAL LOW (ref >39)
LDL Chol Calc (NIH): 63 mg/dL (ref 0–99)
Triglycerides: 238 mg/dL — ABNORMAL HIGH (ref 0–149)
VLDL Cholesterol Cal: 39 mg/dL (ref 5–40)

## 2023-07-28 ENCOUNTER — Encounter (HOSPITAL_BASED_OUTPATIENT_CLINIC_OR_DEPARTMENT_OTHER): Payer: Self-pay | Admitting: Internal Medicine

## 2023-07-28 ENCOUNTER — Encounter (HOSPITAL_BASED_OUTPATIENT_CLINIC_OR_DEPARTMENT_OTHER): Payer: Self-pay

## 2023-07-28 ENCOUNTER — Telehealth (HOSPITAL_BASED_OUTPATIENT_CLINIC_OR_DEPARTMENT_OTHER): Payer: Self-pay | Admitting: Pharmacy Technician

## 2023-07-28 ENCOUNTER — Telehealth (HOSPITAL_BASED_OUTPATIENT_CLINIC_OR_DEPARTMENT_OTHER): Payer: Self-pay

## 2023-07-28 ENCOUNTER — Other Ambulatory Visit (HOSPITAL_COMMUNITY): Payer: Self-pay

## 2023-07-28 ENCOUNTER — Ambulatory Visit (HOSPITAL_BASED_OUTPATIENT_CLINIC_OR_DEPARTMENT_OTHER): Admitting: Internal Medicine

## 2023-07-28 VITALS — BP 122/70 | HR 76 | Ht 62.0 in | Wt 160.7 lb

## 2023-07-28 DIAGNOSIS — E785 Hyperlipidemia, unspecified: Secondary | ICD-10-CM | POA: Diagnosis not present

## 2023-07-28 DIAGNOSIS — M791 Myalgia, unspecified site: Secondary | ICD-10-CM

## 2023-07-28 DIAGNOSIS — T466X5A Adverse effect of antihyperlipidemic and antiarteriosclerotic drugs, initial encounter: Secondary | ICD-10-CM

## 2023-07-28 DIAGNOSIS — T466X5D Adverse effect of antihyperlipidemic and antiarteriosclerotic drugs, subsequent encounter: Secondary | ICD-10-CM

## 2023-07-28 DIAGNOSIS — E119 Type 2 diabetes mellitus without complications: Secondary | ICD-10-CM

## 2023-07-28 DIAGNOSIS — I10 Essential (primary) hypertension: Secondary | ICD-10-CM | POA: Diagnosis not present

## 2023-07-28 MED ORDER — HYDROCHLOROTHIAZIDE 12.5 MG PO CAPS: ORAL_CAPSULE | ORAL | 3 refills | Status: DC

## 2023-07-28 MED ORDER — NEXLETOL 180 MG PO TABS: 1.0000 | ORAL_TABLET | Freq: Every day | ORAL | 1 refills | Status: DC

## 2023-07-28 MED ORDER — PRAVASTATIN SODIUM 20 MG PO TABS: 20.0000 mg | ORAL_TABLET | Freq: Every day | ORAL | 3 refills | Status: AC

## 2023-07-28 NOTE — Progress Notes (Signed)
Lipid Clinic Consult Note  Chief Complaint:  Follow-up dyslipidemia  Primary Care Physician: Shon Hale, MD  HPI:  Shannon Mora is a 62 y.o. female who is being seen today for the evaluation of dyslipidemia at the request of No ref. provider found.  This is a pleasant 62 year old female with type 2 diabetes followed by Dr. Elvera Lennox in endocrinology.  She has been noted to have difficult to control dyslipidemia with primarily elevated triglycerides as well as high LDL.  Her last direct LDL 10 days ago was 166.  Total cholesterol was 233, triglycerides 378 and HDL of 38.5.  Non-HDL cholesterol was 194 (approximately 30 points higher as expected then LDL cholesterol, suggesting concordance).  Unfortunately, Shannon Mora has had difficulty with medications.  She has had side effects with multiple statin medications including atorvastatin, rosuvastatin, simvastatin, ezetimibe, and alternatives such as the fibrates.  Her A1c is well controlled at 6.4.  She is also not an alcohol user.  There is family history of early onset heart disease in her father.  Really she is asymptomatic, denying any chest pain, worsening shortness of breath or associated symptoms.  Her statin side effects and not only included myalgias but significant back pain and joint stiffness.  She also had symptoms with fenofibrate and cholestyramine.  She did get some improvement in her LDL cholesterol on rosuvastatin, but again it was not tolerated.  02/21/2019  Shannon Mora is seen here for follow-up.  She had a marked dyslipidemia as above and underwent CT coronary calcium scoring.  Fortunately showed 0 coronary calcium score.  This is associated with low risk of events.  We had recommended pursuing PCSK9 inhibitor therapy given her intolerance to statins however this was declined due to lack of ASCVD.  Based on this, elected to pursue therapy with a newly available Nexletol.  She has been taking this now for a month and seems  to be tolerating it well.  Although it is only moderate potency, this should be associated with some improvement in her symptoms.  Optionally, we may be able to add ezetimibe to this therapy.  She apparently had had side effects to this however it may have been in combination with simvastatin and the statin is certainly more likely to cause her side effects.  Also today, she noted that blood pressures been elevated.  Although we have been seeing her for dyslipidemia, her PCP requested that we evaluated her for hypertension.  I reviewed blood pressures over the last couple of years which showed generally blood pressures between 140-160 systolic.  As a diabetic, she would benefit for them renal protection with ACE or ARB.  10/31/2019  Shannon Mora returns today for follow-up.  She has persistently elevated triglycerides now 273.  Total cholesterol 177, HDL 36 and LDL at target at 95.  This is based on a 0 calcium score therefore I felt that we did not need to be quite as aggressive with her risk modification.  Her diabetes has been very well controlled.  She sees endocrinology and her A1c was somewhere around 6.2.  We talked about options for lowering her triglycerides, including the fibrate and or Vascepa.  There is little evidence for risk reduction with a fibrate however as diabetic with at least 2 risk factors, she could qualify for Vascepa.  Despite this, she is not interested in additional medications at this time.  She wants to work on more physical activity and weight loss.   05/01/2020  Shannon Mora is  seen today in follow-up.  Its been now 42-month since we last saw each other.  She was planning to work on diet and make other adjustments in her lifestyle and activities.  Unfortunately her lipids are not much better.  LDL well it was at target of 95 is now 150, total cholesterol 233, HDL 36 and triglycerides 284.  She is compliant with the Nexletol.  She said diet has not been as ideal and her exercise levels  are less.  08/02/2020  Shannon Mora is seen today for follow-up.  She reports be more active and is now working part-time job.  She has made some dietary changes and is using generic icosapent ethyl in addition to her bempedoic acid.  The combination of medications has helped her cholesterol with improvement in her triglycerides and a small improvement in LDL.  Her total cholesterol has come down to 210 and LDL is 134.  Triglycerides are still elevated 206 but improved from previous at 284.  11/06/2020  Shannon Mora is seen today in follow-up.  Her lipids are remarkably better.  Her total cholesterol is now down to 143 from 210, triglycerides 230, HDL 36 and LDL 69 down from 134.  This is with the recent addition of pravastatin to her Nexletol.  She remains also on Vascepa.  Overall I think it is a good combination of medications.  Cost is the only other outstanding issue with these.  We may be able to get the cost of her Vascepa down.  05/06/2021  Shannon Mora is seen today for follow-up.  Her cholesterol has trended up a little however still within the target range.  Her total cholesterol now is 157, HDL 32, triglycerides 237 and LDL 85.  She is tolerating the medications and they are affordable for her.  She reports regular physical exercise walking about 2 and half miles every day.  Her A1c is come down significantly to 6.4%.  She has follow-up with her endocrinologist in December.  07/25/2022  Shannon Mora is seen today in follow-up.  She has had a small increase in her hemoglobin A1c.  She said this may have been due to some traveling she did over the summer.  Her triglycerides are up somewhat to 215 but LDL is down to 80, total cholesterol 148 and HDL 32.  Her LDL about 8 months ago was 127.  She remains on pravastatin 40 mg daily and Vascepa 2 g twice daily.  07/28/2023  Shannon Mora returns today for follow-up.  She seems to be doing well.  She has made significant diet and lifestyle modifications.   Her cholesterol is accordingly come down although her triglycerides remain elevated.  Total cholesterol now 136, HDL 34, triglycerides 238 and LDL 63.  She continues on Nexletol, pravastatin and Vascepa.  Blood pressure is excellent today.  She is struggled with some orthopedic issues in her right knee was diagnosed with osteopenia but otherwise is doing well.  Currently she cannot exercise as much as she would like.  PMHx:  Past Medical History:  Diagnosis Date   Basal cell cancer    On forehead.   GERD (gastroesophageal reflux disease)    HSV-1 (herpes simplex virus 1) infection    Hyperglycemia    Hyperlipidemia    Perimenopausal    Vitamin D deficiency      FAMHx:  Family History  Problem Relation Age of Onset   Heart disease Father     SOCHx:   reports that she has never  smoked. She has never used smokeless tobacco. She reports current alcohol use of about 1.0 standard drink of alcohol per week. She reports that she does not use drugs.  ALLERGIES:  Allergies  Allergen Reactions   Statins Other (See Comments)    Atorvastatin, rosuvastatin, simvastatin, ezetimibe    ROS: Pertinent items noted in HPI and remainder of comprehensive ROS otherwise negative.  HOME MEDS: Current Outpatient Medications on File Prior to Visit  Medication Sig Dispense Refill   Bempedoic Acid (NEXLETOL) 180 MG TABS TAKE 1 TABLET DAILY 90 tablet 1   Cholecalciferol (VITAMIN D3) 2000 units TABS Take by mouth.     glucose blood (FREESTYLE LITE) test strip Use to check blood sugars 1-2x daily 200 each 3   hydrochlorothiazide (MICROZIDE) 12.5 MG capsule TAKE 1 CAPSULE DAILY 90 capsule 1   icosapent Ethyl (VASCEPA) 1 g capsule TAKE 2 CAPSULES TWICE A DAY 360 capsule 3   Lancets (FREESTYLE) lancets Use to check blood sugars 1-2x daily 200 each 3   Magnesium 500 MG TABS Take 1 tablet by mouth every morning.     meloxicam (MOBIC) 15 MG tablet Take 15 mg by mouth as needed.     metFORMIN (GLUCOPHAGE) 500  MG tablet Take 1 tablet (500 mg total) by mouth 2 (two) times daily with a meal. 180 tablet 3   Oyster Shell (OYSTER CALCIUM) 500 MG TABS tablet Take 500 mg of elemental calcium by mouth 2 (two) times daily.     pravastatin (PRAVACHOL) 20 MG tablet TAKE 1 TABLET(20 MG) BY MOUTH EVERY EVENING 90 tablet 3   telmisartan (MICARDIS) 80 MG tablet Take 1 tablet (80 mg total) by mouth daily. 90 tablet 3   Multiple Vitamins-Minerals (EYE VITAMINS PO) Take 1 capsule by mouth daily.     No current facility-administered medications on file prior to visit.    LABS/IMAGING: No results found for this or any previous visit (from the past 48 hour(s)). No results found.  LIPID PANEL:    Component Value Date/Time   CHOL 136 07/20/2023 0924   TRIG 238 (H) 07/20/2023 0924   TRIG 246 (H) 12/02/2013 1239   HDL 34 (L) 07/20/2023 0924   HDL 45 12/02/2013 1239   CHOLHDL 4.0 07/20/2023 0924   CHOLHDL 6 06/07/2018 0819   VLDL 75.6 (H) 06/07/2018 0819   LDLCALC 63 07/20/2023 0924   LDLCALC 138 (H) 12/02/2013 1239   LDLDIRECT 150 (H) 04/24/2020 0949   LDLDIRECT 166.0 06/07/2018 0819    WEIGHTS: Wt Readings from Last 3 Encounters:  07/28/23 160 lb 11.2 oz (72.9 kg)  03/05/23 153 lb 6.4 oz (69.6 kg)  11/03/22 160 lb 6.4 oz (72.8 kg)    VITALS: BP 122/70 (BP Location: Right Arm, Patient Position: Sitting, Cuff Size: Small)   Pulse 76   Ht 5\' 2"  (1.575 m)   Wt 160 lb 11.2 oz (72.9 kg)   LMP 10/07/2013   BMI 29.39 kg/m   EXAM: Deferred  EKG: Deferred  ASSESSMENT: Mixed dyslipidemia (high LDL, triglycerides and low HDL), goal LDL <70 Type 2 [id number] 6.9% Family history of premature coronary disease - Zero (0) CAC Statin intolerance - myalgias Essential hypertension  PLAN: 1.   Shannon Mora has further improved her cholesterol with her LDL now at target less than 70.  Triglycerides remain elevated although her A1c has come down to 6.9%.  I do think these will improve further with more  exercise once her knee recovers.  Will plan to continue her current  medications.  Will provide refills for the medications she asked today.  Plan follow-up with me annually or sooner as necessary.  Chrystie Nose, MD, [facility], FACP  [place]  [facility]  Medical Director of the Advanced Lipid Disorders &  Cardiovascular Risk Reduction Clinic Diplomate of the American Board of Clinical Lipidology Attending Cardiologist  Direct Dial: [phone]  Fax: [phone]  Website:  [email or web address] Nicely Chelsi Warr 07/28/2023, 10:27 AM

## 2023-07-28 NOTE — Telephone Encounter (Signed)
Called patient regarding prior auth. Rx refilled. She verbalized understanding and was thankful for call.

## 2023-07-28 NOTE — Patient Instructions (Signed)
Medication Instructions:  Your physician recommends that you continue on your current medications as directed. Please refer to the Current Medication list given to you today.  *If you need a refill on your cardiac medications before your next appointment, please call your pharmacy*  Follow-Up: At [facility], you and your health needs are our priority.  As part of our continuing mission to provide you with exceptional heart care, we have created designated Provider Care Teams.  These Care Teams include your primary Cardiologist (physician) and Advanced Practice Providers (APPs -  Physician Assistants and Nurse Practitioners) who all work together to provide you with the care you need, when you need it.  We recommend signing up for the patient portal called "MyChart".  Sign up information is provided on this After Visit Summary.  MyChart is used to connect with patients for Virtual Visits (Telemedicine).  Patients are able to view lab/test results, encounter notes, upcoming appointments, etc.  Non-urgent messages can be sent to your provider as well.   To learn more about what you can do with MyChart, go to [email or web address].    Your next appointment:   12 month(s)  Provider:   Dr. Debara Pickett

## 2023-07-28 NOTE — Telephone Encounter (Signed)
Pharmacy Patient Advocate Encounter   Received notification from Pt Calls Messages that prior authorization for nexlizet is required/requested.   Insurance verification completed.   The patient is insured through [employer] .   Per test claim: Refill too soon. PA is not needed at this time. Medication was filled 07/28/23. Next eligible fill date is 10/03/23.  Test claim uploaded for reference if needed.

## 2023-08-04 ENCOUNTER — Ambulatory Visit
Admission: RE | Admit: 2023-08-04 | Discharge: 2023-08-04 | Disposition: A | Discharge status: Home / Self Care | Source: Ambulatory Visit | Attending: Orthopaedic Surgery

## 2023-08-04 ENCOUNTER — Other Ambulatory Visit: Payer: Self-pay | Admitting: Orthopaedic Surgery

## 2023-08-04 DIAGNOSIS — S8001XA Contusion of right knee, initial encounter: Secondary | ICD-10-CM

## 2023-09-07 ENCOUNTER — Ambulatory Visit (INDEPENDENT_AMBULATORY_CARE_PROVIDER_SITE_OTHER): Admitting: Internal Medicine

## 2023-09-07 ENCOUNTER — Encounter: Payer: Self-pay | Admitting: Internal Medicine

## 2023-09-07 VITALS — BP 130/70 | HR 87 | Ht 62.0 in | Wt 159.0 lb

## 2023-09-07 DIAGNOSIS — E663 Overweight: Secondary | ICD-10-CM

## 2023-09-07 DIAGNOSIS — Z7984 Long term (current) use of oral hypoglycemic drugs: Secondary | ICD-10-CM | POA: Diagnosis not present

## 2023-09-07 DIAGNOSIS — E785 Hyperlipidemia, unspecified: Secondary | ICD-10-CM | POA: Diagnosis not present

## 2023-09-07 DIAGNOSIS — E119 Type 2 diabetes mellitus without complications: Secondary | ICD-10-CM | POA: Diagnosis not present

## 2023-09-07 LAB — POCT GLYCOSYLATED HEMOGLOBIN (HGB A1C): Hemoglobin A1C: 6.8 % — AB (ref 4.0–5.6)

## 2023-09-07 NOTE — Progress Notes (Signed)
 Patient ID: Shannon Mora, female   DOB: 1961/01/16, 63 y.o.   MRN: [id number]  HPI: Shannon Mora is a 63 y.o.-year-old female, returning for follow-up for DM2, dx in 2015, prev. GDM 1989, 1992, non-insulin-dependent, controlled, without long-term complications and also hyperlipidemia.  Last visit 6 months ago.  Interim history: No increased urination, blurry vision, nausea, chest pain. She fell and fractured her R tibial plateau 1.5 mo ago. Now in brace.  DM2: Reviewed HbA1c levels: 03/05/2023: HbA1c 6.8% Lab Results  Component Value Date   HGBA1C 6.9 (A) 11/03/2022   HGBA1C 7.1 (A) 05/20/2022   HGBA1C 6.9 (A) 11/15/2021  07/07/2017: HbA1c 6.8% 03/03/2017: HbA1c 6.5% 10/30/2016: HbA1c 6.6%  Patient is on: - Metformin  500 mg in a.m. >> at night >> 2x a day  She is checking her sugars once a day: - am:  113-133, 146 >> 112-134 >> 114-135, 142 >> 122-131 - 2h after b'fast: 115 >> 143 >> 95, 103-126 >> 112 >> 124-133 - before lunch:  98, 111 >> 119 >> 88, 95 >> 85 >> 124 - 2h after lunch: 111, 125 >> 119 >> 86-108 >> 122 >> n/c - before dinner:  86, 97 >> 121 >> 88 >> 124 >> 80 - 2h after dinner: 103, 121 >> 150, 200 >> 109 >> n/c >> 100, 153 - bedtime:  121, 168 >> n/c >> 113 >> n/c - nighttime: n/c Lowest sugar was 113 >> 86 >> 85 >> 88; it is unclear at which level she has hypoglycemia awareness. Highest sugar was 200 (rice) >> 134 >> 142 >> 153  Glucometer: AccuCheck  Pt's meals are: - Breakfast: egg white, vegan links, coffee - Lunch: protein shake >> salad + turkey breast (1/4 cup) - Dinner: vegetarian dish or fish/poultry - Snacks: 1-2  -No CKD, last BUN/creatinine:  Comp Metabolic Panel Reviewed date:12/03/2022 07:41:59 AM Interpretation:normal Performing Lab: Notes/Report: Testing Performed at: [facility], [address], [address], [place], [place] [address]  Glucose 93 70-99 mg/dL  BUN 21 3-73 mg/dL  Creatinine 9.23 9.39-8.69 mg/dl  [id number] 89 >39 calc   Sodium 140 136-145 mmol/L  Potassium 3.8 3.5-5.5 mmol/L  Chloride 100 98-107 mmol/L  CO2 31 22-32 mmol/L  Anion Gap 13.2 6.0-20.0 mmol/L  Calcium 10.8 8.6-10.3 mg/dL  CA-corrected 89.87 1.39-89.69 mg/dL  Protein, Total 7.4 3.9-1.6 g/dL  Albumin 4.8 6.5-5.1 g/dL  TBIL 0.4 9.6-8.9 mg/dL  ALP 47 61-873 U/L  AST 24 0-39 U/L  ALT 20 0-52 U/L  Microalbumin Panel Reviewed date:12/03/2022 07:42:28 AM Interpretation:normal Performing Lab: Notes/Report: Testing Performed at: [facility], [address], [address], [place], [place] [address]  MA/CR ratio <9.7 0.0-30.0 mg/G  UMA <0.7    UCR 72     Lab Results  Component Value Date   BUN 23 11/15/2021   BUN 22 08/09/2020   CREATININE 0.83 11/15/2021   CREATININE 0.72 08/09/2020  07/07/2017: Glucose 108, BUN/creatinine 14/0.75 She is on telmisartan .  - last eye exam was 03/20/2022: No DR reportedly, + macular degeneration (pseudovitelliform dystrophy). Dr. Daina (retired) and Dr. Alvia.  -No numbness and tingling in her feet.  Last foot exam 11/03/2022.  Pt has FH of DM in brother.  Hyperlipidemia: -+ Familial hyperlipidemia  Reviewed latest lipid panel: Lab Results  Component Value Date   CHOL 136 07/20/2023   CHOL 148 07/14/2022   CHOL 191 11/20/2021   Lab Results  Component Value Date   HDL 34 (L) 07/20/2023   HDL 32 (L) 07/14/2022  HDL 39 (L) 11/20/2021   Lab Results  Component Value Date   LDLCALC 63 07/20/2023   LDLCALC 80 07/14/2022   LDLCALC 127 (H) 11/20/2021   Lab Results  Component Value Date   TRIG 238 (H) 07/20/2023   TRIG 215 (H) 07/14/2022   TRIG 139 11/20/2021   Lab Results  Component Value Date   CHOLHDL 4.0 07/20/2023   CHOLHDL 4.6 (H) 07/14/2022   CHOLHDL 4.9 (H) 11/20/2021   Lab Results  Component Value Date   LDLDIRECT 150 (H) 04/24/2020   LDLDIRECT 105 (H) 10/28/2019   LDLDIRECT 104 (H) 08/24/2019  We tried fluvastatin  XL (after preauthorization).  She could not tolerate  this due to muscle aches and joint aches even when she was taking it every 3 days. She was on ezetimibe  >> mm pain She is intolerant to statins: tried Lipitor, low-dose Crestor >> mm pain.  However, Crestor worked great for her >> LDL decreased to 51. She retried this 2020 >> joint pain She tried Fenofibrate and also Cholestyramine >> did not work. Currently on pravastatin  20 mg daily, Vascepa  2 g twice a day and Bempedoic acid  180 mg daily.  Her father died in his 19s from heart disease.  She has multiple relatives on father side with hyperlipidemia and heart disease.  ROS: + see HPI  I reviewed pt's medications, allergies, PMH, social hx, family hx, and changes were documented in the history of present illness. Otherwise, unchanged from my initial visit note.  Surgeries: - tubal ligation sx 1992 - gall bladder sx - low lumbar sx  Past Medical History:  Diagnosis Date   Basal cell cancer    On forehead.   GERD (gastroesophageal reflux disease)    HSV-1 (herpes simplex virus 1) infection    Hyperglycemia    Hyperlipidemia    Perimenopausal    Vitamin D deficiency    Social History   Socioeconomic History   Marital status: Married    Spouse name: Not on file   Number of children: 2  Social Needs  Occupational History   Not on file  Tobacco Use   Smoking status: Never Smoker   Smokeless tobacco: Never Used  Substance and Sexual Activity   Alcohol use: Yes    Alcohol/week: 0.6 oz    Types: 1 Glasses of hard cider per mo    Comment: monthly   Drug use: No   Current Outpatient Medications on File Prior to Visit  Medication Sig Dispense Refill   Bempedoic Acid  (NEXLETOL ) 180 MG TABS Take 1 tablet (180 mg total) by mouth daily. 90 tablet 1   Cholecalciferol (VITAMIN D3) 2000 units TABS Take by mouth.     glucose blood (FREESTYLE LITE) test strip Use to check blood sugars 1-2x daily 200 each 3   hydrochlorothiazide  (MICROZIDE ) 12.5 MG capsule TAKE 1 CAPSULE DAILY 90  capsule 3   icosapent  Ethyl (VASCEPA ) 1 g capsule TAKE 2 CAPSULES TWICE A DAY 360 capsule 3   Lancets (FREESTYLE) lancets Use to check blood sugars 1-2x daily 200 each 3   Magnesium 500 MG TABS Take 1 tablet by mouth every morning.     meloxicam (MOBIC) 15 MG tablet Take 15 mg by mouth as needed.     metFORMIN  (GLUCOPHAGE ) 500 MG tablet Take 1 tablet (500 mg total) by mouth 2 (two) times daily with a meal. 180 tablet 3   Multiple Vitamins-Minerals (EYE VITAMINS PO) Take 1 capsule by mouth daily.     [employer] (  OYSTER CALCIUM) 500 MG TABS tablet Take 500 mg of elemental calcium by mouth 2 (two) times daily.     pravastatin  (PRAVACHOL ) 20 MG tablet Take 1 tablet (20 mg total) by mouth daily. 90 tablet 3   telmisartan  (MICARDIS ) 80 MG tablet Take 1 tablet (80 mg total) by mouth daily. 90 tablet 3   No current facility-administered medications on file prior to visit.   Allergies  Allergen Reactions   Statins Other (See Comments)    Atorvastatin, rosuvastatin, simvastatin, ezetimibe    Family History  Problem Relation Age of Onset   Heart disease Father    PE: BP 130/70   Pulse 87   Ht 5' 2 (1.575 m)   Wt 159 lb (72.1 kg)   LMP 10/07/2013   SpO2 98%   BMI 29.08 kg/m  Wt Readings from Last 3 Encounters:  09/07/23 159 lb (72.1 kg)  07/28/23 160 lb 11.2 oz (72.9 kg)  03/05/23 153 lb 6.4 oz (69.6 kg)   Constitutional: Slightly overweight, in NAD Eyes: EOMI, no exophthalmos ENT: no thyromegaly, no cervical lymphadenopathy Cardiovascular: RRR, No MRG Respiratory: CTA B Musculoskeletal: no deformities Skin: no rashes Neurological: no tremor with outstretched hands Diabetic Foot Exam - Simple   Simple Foot Form Diabetic Foot exam was performed with the following findings: Yes 09/07/2023 10:48 AM  Visual Inspection No deformities, no ulcerations, no other skin breakdown bilaterally: Yes Sensation Testing Intact to touch and monofilament testing bilaterally: Yes Pulse  Check Posterior Tibialis and Dorsalis pulse intact bilaterally: Yes Comments    ASSESSMENT: 1. DM2, non-insulin-dependent, controlled, without long term complications  2. HL  3.  Overweight  PLAN:  1. Patient with history of controlled type 2 diabetes, on metformin  only with improving HbA1c at last visit, 6.8%.  At that time, sugars appears to be at goal, with only few mildly hyperglycemic exceptions.  We did not change her regimen.  -At today's visit, the vast majority of her blood sugars are not despite the holidays and the fact that she is not active due to her knee brace.  She initially had a fragility fracture but then fell and fractured the tibial plateau.  She did not have to have surgery.  However, she is not able to walk.  We discussed about the importance of exercising inside the house including upper core strength. - I suggested to:  Patient Instructions  Please increase:  - Metformin  500 mg 2x with meals  Please return in 4 months with your sugar log.   - we checked her HbA1c: 6.8% (stable) - advised to check sugars at different times of the day - 1x a day, rotating check times - advised for yearly eye exams >> she is not UTD.  She is trying to find another ophthalmologist after Dr. Alona retired.  She - return to clinic in 4 months  2. HL -Patient with familial hyperlipidemia, with history of coronary heart disease in her father in his early 56s -She did not tolerate Lipitor, Crestor, pravastatin , and fluvastatin  in the past due to muscle cramps.  Also, she tried fenofibrate and bile acid sequestrant but this did not work well for her.   -Latest lipid panel was reviewed from 06/2023: Much improved, but we still elevated triglycerides and low HDL: Lab Results  Component Value Date   CHOL 136 07/20/2023   HDL 34 (L) 07/20/2023   LDLCALC 63 07/20/2023   LDLDIRECT 150 (H) 04/24/2020   TRIG 238 (H) 07/20/2023   CHOLHDL 4.0 07/20/2023  -  She continues on pravastatin   20 mg daily, Vascepa  2 g twice a day.  A PCSK9 inhibitor was not approved for her.  She started Bempedoic acid .  She goes to the lipid clinic.  3.  Overweight -In the past, she did well on a modified plant-based diet but she came off the diet afterwards -She continues to pay close attention to her diet.  Before last visit, she lost 7 pounds, previously lost 3 -She gained 6 pounds since last visit -Will continue metformin  which should also help with appetite reduction  Lela Fendt, MD PhD [facility]

## 2023-09-07 NOTE — Patient Instructions (Signed)
Please increase:  - Metformin 500 mg 2x with meals  Please return in 4 months with your sugar log.

## 2023-10-12 ENCOUNTER — Encounter (HOSPITAL_BASED_OUTPATIENT_CLINIC_OR_DEPARTMENT_OTHER): Payer: Self-pay | Admitting: Internal Medicine

## 2023-12-02 ENCOUNTER — Other Ambulatory Visit: Payer: Self-pay

## 2023-12-02 ENCOUNTER — Telehealth: Payer: Self-pay | Admitting: Pharmacy Technician

## 2023-12-02 DIAGNOSIS — M810 Age-related osteoporosis without current pathological fracture: Secondary | ICD-10-CM

## 2023-12-02 NOTE — Telephone Encounter (Signed)
 Auth Submission: NO AUTH NEEDED Site of care: Site of care: [facility] Payer: [employer] Medication & CPT/J Code(s) submitted: Reclast (Zolendronic acid) [id number] Route of submission (phone, fax, portal):  Phone # Fax # Auth type: Buy/Bill PB Units/visits requested: X1 DOSE Reference number:  Approval from: 12/02/23 to 08/24/24

## 2023-12-30 ENCOUNTER — Ambulatory Visit (INDEPENDENT_AMBULATORY_CARE_PROVIDER_SITE_OTHER): Admitting: Internal Medicine

## 2023-12-30 ENCOUNTER — Encounter: Payer: Self-pay | Admitting: Internal Medicine

## 2023-12-30 ENCOUNTER — Encounter (HOSPITAL_BASED_OUTPATIENT_CLINIC_OR_DEPARTMENT_OTHER): Payer: Self-pay | Admitting: Internal Medicine

## 2023-12-30 VITALS — BP 120/80 | HR 82 | Ht 62.0 in | Wt 159.2 lb

## 2023-12-30 DIAGNOSIS — E663 Overweight: Secondary | ICD-10-CM

## 2023-12-30 DIAGNOSIS — Z7984 Long term (current) use of oral hypoglycemic drugs: Secondary | ICD-10-CM

## 2023-12-30 DIAGNOSIS — E785 Hyperlipidemia, unspecified: Secondary | ICD-10-CM

## 2023-12-30 DIAGNOSIS — E119 Type 2 diabetes mellitus without complications: Secondary | ICD-10-CM

## 2023-12-30 LAB — POCT GLYCOSYLATED HEMOGLOBIN (HGB A1C): Hemoglobin A1C: 7.3 % — AB (ref 4.0–5.6)

## 2023-12-30 NOTE — Progress Notes (Addendum)
 Patient ID: Shannon Mora, female   DOB: 06/24/1961, 63 y.o.   MRN: [id number]  HPI: Shannon Mora is a 63 y.o.-year-old female, returning for follow-up for DM2, dx in 2015, prev. GDM 1989, 1992, non-insulin-dependent, controlled, without long-term complications and also hyperlipidemia.  Last visit 6 months ago.  Interim history: No increased urination, blurry vision, nausea, chest pain. She fell and fractured her R tibial plateau 1.5 mo ago.  She was in a brace at last visit.  This healed. She tried Tymlos >> HR increased, despite moving it every other day - Shannon Mora at [facility]. She had to stop after 1 mo. Will start Reclast next mo. She has a URI >> flu, RSV, Covid negative. On Nyquil.  DM2: Reviewed HbA1c levels: 09/24/2023: HbA1c 7.0% Lab Results  Component Value Date   HGBA1C 6.8 (A) 09/07/2023   HGBA1C 6.8 03/05/2023   HGBA1C 6.9 (A) 11/03/2022  03/05/2023: HbA1c 6.8% 07/07/2017: HbA1c 6.8% 03/03/2017: HbA1c 6.5% 10/30/2016: HbA1c 6.6%  Patient is on: - Metformin  500 mg in a.m. >> at night >> 2x a day  She is checking her sugars 0-1x a day: - am:  112-134 >> 114-135, 142 >> 122-131 >> 104-146 - 2h after b'fast: 95, 103-126 >> 112 >> 124-133 >> 154 - before lunch:  119 >> 88, 95 >> 85 >> 124 >> 88, 96 - 2h after lunch: 111, 125 >> 119 >> 86-108 >> 122 >> n/c - before dinner:  86, 97 >> 121 >> 88 >> 124 >> 80 >> 113 - 2h after dinner: 150, 200 >> 109 >> n/c >> 100, 153 >> 124 - bedtime:  121, 168 >> n/c >> 113 >> n/c >> 101 - nighttime: n/c Lowest sugar was 85 >> 88 >> 88; it is unclear at which level she has hypoglycemia awareness. Highest sugar was 200 (rice) >> .Shannon AasAaron Mora 154.  Glucometer: AccuCheck  Pt's meals are: - Breakfast: egg white, vegan links, coffee - Lunch: protein shake >> salad + [place] breast (1/4 cup) - Dinner: vegetarian dish or fish/poultry - Snacks: 1-2  -No CKD, last BUN/creatinine:  11/18/2023: 23/1.79, glucose 165 09/24/2023: GFR  83 12/01/2022: MA/CR ratio <9.7 0.0-30.0 mg/G  UMA <0.7    UCR 72     Lab Results  Component Value Date   BUN 23 11/15/2021   BUN 22 08/09/2020   CREATININE 0.83 11/15/2021   CREATININE 0.72 08/09/2020  She is on telmisartan .  - last eye exam was 2024: No DR reportedly, + macular degeneration (pseudovitelliform dystrophy). Shannon Mora (retired) >> now Shannon Mora in [facility] -exam coming up 03/23/2024 (also sees Shannon Mora).   -No numbness and tingling in her feet.  Last foot exam 09/07/2023.  Pt has FH of DM in brother.  Hyperlipidemia: -+ Familial hyperlipidemia  Reviewed latest lipid panel: Lab Results  Component Value Date   CHOL 136 07/20/2023   CHOL 148 07/14/2022   CHOL 191 11/20/2021   Lab Results  Component Value Date   HDL 34 (L) 07/20/2023   HDL 32 (L) 07/14/2022   HDL 39 (L) 11/20/2021   Lab Results  Component Value Date   LDLCALC 63 07/20/2023   LDLCALC 80 07/14/2022   LDLCALC 127 (H) 11/20/2021   Lab Results  Component Value Date   TRIG 238 (H) 07/20/2023   TRIG 215 (H) 07/14/2022   TRIG 139 11/20/2021   Lab Results  Component Value Date   CHOLHDL 4.0 07/20/2023   CHOLHDL 4.6 (H) 07/14/2022   CHOLHDL  4.9 (H) 11/20/2021   Lab Results  Component Value Date   LDLDIRECT 150 (H) 04/24/2020   LDLDIRECT 105 (H) 10/28/2019   LDLDIRECT 104 (H) 08/24/2019  We tried fluvastatin  XL (after preauthorization).  She could not tolerate this due to muscle aches and joint aches even when she was taking it every 3 days. She was on ezetimibe  >> mm pain She is intolerant to statins: tried Lipitor, low-dose Crestor >> mm pain.  However, Crestor worked great for her >> LDL decreased to 51. She retried this 2020 >> joint pain She tried Fenofibrate and also Cholestyramine >> did not work. Currently on pravastatin  20 mg daily, Vascepa  2 g twice a day and Bempedoic acid  180 mg daily.  Her father died in his 47s from heart disease.  She has multiple relatives on  father side with hyperlipidemia and heart disease.  ROS: + see HPI  I reviewed pt's medications, allergies, PMH, social hx, family hx, and changes were documented in the history of present illness. Otherwise, unchanged from my initial visit note.  Surgeries: - tubal ligation sx 1992 - gall bladder sx - low lumbar sx  Past Medical History:  Diagnosis Date   Basal cell cancer    On forehead.   GERD (gastroesophageal reflux disease)    HSV-1 (herpes simplex virus 1) infection    Hyperglycemia    Hyperlipidemia    Perimenopausal    Vitamin D deficiency    Social History   Socioeconomic History   Marital status: Married    Spouse name: Not on file   Number of children: 2  Social Needs  Occupational History   Not on file  Tobacco Use   Smoking status: Never Smoker   Smokeless tobacco: Never Used  Substance and Sexual Activity   Alcohol use: Yes    Alcohol/week: 0.6 oz    Types: 1 Glasses of hard cider per mo    Comment: monthly   Drug use: No   Current Outpatient Medications on File Prior to Visit  Medication Sig Dispense Refill   Bempedoic Acid  (NEXLETOL ) 180 MG TABS Take 1 tablet (180 mg total) by mouth daily. 90 tablet 1   Cholecalciferol (VITAMIN D3) 2000 units TABS Take by mouth.     glucose blood (FREESTYLE LITE) test strip Use to check blood sugars 1-2x daily 200 each 3   hydrochlorothiazide  (MICROZIDE ) 12.5 MG capsule TAKE 1 CAPSULE DAILY 90 capsule 3   icosapent  Ethyl (VASCEPA ) 1 g capsule TAKE 2 CAPSULES TWICE A DAY 360 capsule 3   Lancets (FREESTYLE) lancets Use to check blood sugars 1-2x daily 200 each 3   Magnesium 500 MG TABS Take 1 tablet by mouth every morning.     meloxicam (MOBIC) 15 MG tablet Take 15 mg by mouth as needed.     metFORMIN  (GLUCOPHAGE ) 500 MG tablet Take 1 tablet (500 mg total) by mouth 2 (two) times daily with a meal. 180 tablet 3   Multiple Vitamins-Minerals (EYE VITAMINS PO) Take 1 capsule by mouth daily.     [employer] (OYSTER  CALCIUM) 500 MG TABS tablet Take 500 mg of elemental calcium by mouth 2 (two) times daily.     pravastatin  (PRAVACHOL ) 20 MG tablet Take 1 tablet (20 mg total) by mouth daily. 90 tablet 3   telmisartan  (MICARDIS ) 80 MG tablet Take 1 tablet (80 mg total) by mouth daily. 90 tablet 3   TYMLOS 3120 MCG/1.56ML SOPN      No current facility-administered medications on file  prior to visit.   Allergies  Allergen Reactions   Statins Other (See Comments)    Atorvastatin, rosuvastatin, simvastatin, ezetimibe    Family History  Problem Relation Age of Onset   Heart disease Father    PE: BP 120/80   Pulse 82   Ht 5\' 2"  (1.575 m)   Wt 159 lb 3.2 oz (72.2 kg)   LMP 10/07/2013   SpO2 96%   BMI 29.12 kg/m  Wt Readings from Last 3 Encounters:  12/30/23 159 lb 3.2 oz (72.2 kg)  09/07/23 159 lb (72.1 kg)  07/28/23 160 lb 11.2 oz (72.9 kg)   Constitutional: Slightly overweight, in NAD Eyes: EOMI, no exophthalmos ENT: no thyromegaly, no cervical lymphadenopathy Cardiovascular: RRR, No MRG Respiratory: CTA B Musculoskeletal: no deformities Skin: no rashes Neurological: no tremor with outstretched hands  ASSESSMENT: 1. DM2, non-insulin-dependent, controlled, without long term complications  2. HL  3.  Overweight  PLAN:  1. Patient with history of controlled type 2 diabetes, on metformin  only.  Latest HbA1c was 7.0%, slightly higher, 4 months ago.  At today's visit, she continues on metformin  only.  Sugars have been at goal at home except for the last 2 weeks when she had a URI and has been using NyQuil.  During this period of time, sugars were higher in the morning.  I did advise her to try to stop NyQuil, if possible.  For now, especially and she is increasing exercise (walking), I did not suggest any changes in her regimen.  Since the HbA1c appears to be higher than expected from her log (see below), will also check a fructosamine level. - I suggested to:  Patient Instructions  Please  increase:  - Metformin  500 mg 2x with meals  Please return in 4 months with your sugar log.   - we checked her HbA1c: 7.3% (higher) - advised to check sugars at different times of the day - 1x a day, rotating check times - advised for yearly eye exams >> she is not UTD but has an exam coming up - return to clinic in 4 months  2. HL - Patient has a family of familial hyperlipidemia with history of heart disease in her father in his early 3s -She did not tolerate Lipitor, Crestor, and fluvastatin  due to muscle cramps.  She tried fenofibrate and bile acid sequestrant but these did not work well for her. - Lipid panel was reviewed from 06/2023: Much improved, but still with elevated triglycerides and low HDL: Lab Results  Component Value Date   CHOL 136 07/20/2023   HDL 34 (L) 07/20/2023   LDLCALC 63 07/20/2023   LDLDIRECT 150 (H) 04/24/2020   TRIG 238 (H) 07/20/2023   CHOLHDL 4.0 07/20/2023  -She tolerates pravastatin  20 mg daily without muscle cramps.  She is also on Vascepa  2 g twice a day and Bempedoic acid  180 mg daily.  She goes to the lipid clinic.  A PCSK9 inhibitor was not covered for her.  3.  Overweight -In the past, she did well on a modified plant-based diet but she came off the diet afterwards - She continues to pay close attention to her diet.  She is also on metformin  which has an appetite suppressant effect long-term - She gained 6 pounds before last visit, and wt is stable since then  Component     Latest Ref Rng 12/30/2023  Hemoglobin A1C     4.0 - 5.6 % 7.3 !   Fructosamine     205 -  285 umol/L 267   HbA1c calculated from fructosamine is excellent, at 6.1%, correlating better with her blood sugars at home.  Emilie Harden, MD PhD [facility]

## 2023-12-30 NOTE — Patient Instructions (Signed)
 Please increase:  - Metformin  500 mg 2x with meals  Please stop at the lab.  Please return in 4 months with your sugar log.

## 2024-01-01 ENCOUNTER — Encounter: Payer: Self-pay | Admitting: Internal Medicine

## 2024-01-01 LAB — FRUCTOSAMINE: Fructosamine: 267 umol/L (ref 205–285)

## 2024-01-05 ENCOUNTER — Ambulatory Visit: Admitting: Internal Medicine

## 2024-02-02 ENCOUNTER — Other Ambulatory Visit: Payer: Self-pay | Admitting: Internal Medicine

## 2024-02-04 ENCOUNTER — Ambulatory Visit

## 2024-02-04 VITALS — BP 137/79 | HR 76 | Temp 98.4°F | Resp 16 | Ht 63.0 in | Wt 162.6 lb

## 2024-02-04 DIAGNOSIS — M810 Age-related osteoporosis without current pathological fracture: Secondary | ICD-10-CM

## 2024-02-04 MED ORDER — ACETAMINOPHEN 325 MG PO TABS
650.0000 mg | ORAL_TABLET | Freq: Once | ORAL | Status: AC
  Administered 2024-02-04: 650 mg via ORAL
  Filled 2024-02-04: qty 2

## 2024-02-04 MED ORDER — DIPHENHYDRAMINE HCL 25 MG PO CAPS
25.0000 mg | ORAL_CAPSULE | Freq: Once | ORAL | Status: AC
  Administered 2024-02-04: 25 mg via ORAL
  Filled 2024-02-04: qty 1

## 2024-02-04 MED ORDER — ZOLEDRONIC ACID 5 MG/100ML IV SOLN
5.0000 mg | Freq: Once | INTRAVENOUS | Status: AC
  Administered 2024-02-04: 5 mg via INTRAVENOUS
  Filled 2024-02-04: qty 100

## 2024-02-04 NOTE — Progress Notes (Signed)
 Diagnosis: Osteoporosis  Provider:  Phyllis Breeze MD  Procedure: IV Infusion  IV Type: Peripheral, IV Location: L Antecubital  Reclast (Zolendronic Acid), Dose: 5 mg  Infusion Start Time: 0957  Infusion Stop Time: 1026  Post Infusion IV Care: Observation period completed and Peripheral IV Discontinued  Discharge: Condition: Good, Destination: Home . AVS Provided  Performed by:  Lauran Pollard, LPN

## 2024-03-07 ENCOUNTER — Other Ambulatory Visit (HOSPITAL_BASED_OUTPATIENT_CLINIC_OR_DEPARTMENT_OTHER): Payer: Self-pay | Admitting: Internal Medicine

## 2024-05-04 ENCOUNTER — Other Ambulatory Visit: Payer: Self-pay | Admitting: Internal Medicine

## 2024-05-06 ENCOUNTER — Encounter: Payer: Self-pay | Admitting: Internal Medicine

## 2024-05-06 ENCOUNTER — Ambulatory Visit (INDEPENDENT_AMBULATORY_CARE_PROVIDER_SITE_OTHER): Admitting: Internal Medicine

## 2024-05-06 ENCOUNTER — Telehealth: Payer: Self-pay | Admitting: Internal Medicine

## 2024-05-06 VITALS — BP 120/70 | HR 85 | Ht 63.0 in | Wt 162.2 lb

## 2024-05-06 DIAGNOSIS — E785 Hyperlipidemia, unspecified: Secondary | ICD-10-CM

## 2024-05-06 DIAGNOSIS — T466X5A Adverse effect of antihyperlipidemic and antiarteriosclerotic drugs, initial encounter: Secondary | ICD-10-CM

## 2024-05-06 DIAGNOSIS — E663 Overweight: Secondary | ICD-10-CM | POA: Diagnosis not present

## 2024-05-06 DIAGNOSIS — E119 Type 2 diabetes mellitus without complications: Secondary | ICD-10-CM | POA: Diagnosis not present

## 2024-05-06 DIAGNOSIS — Z7984 Long term (current) use of oral hypoglycemic drugs: Secondary | ICD-10-CM | POA: Diagnosis not present

## 2024-05-06 LAB — POCT GLYCOSYLATED HEMOGLOBIN (HGB A1C): Hemoglobin A1C: 7.5 % — AB (ref 4.0–5.6)

## 2024-05-06 NOTE — Telephone Encounter (Signed)
 Patient will need lab orders placed for lipid check before her December appt with the lipid clinic

## 2024-05-06 NOTE — Patient Instructions (Addendum)
 Please move: - Metformin  1000 mg with dinner  Build up exercise.  Please return in 4 months with your sugar log.

## 2024-05-06 NOTE — Telephone Encounter (Signed)
 Lab  has been placed in the [facility] system

## 2024-05-06 NOTE — Addendum Note (Signed)
 Addended by: CLEOTILDE ROLIN RAMAN on: 05/06/2024 11:20 AM   Modules accepted: Orders

## 2024-05-06 NOTE — Progress Notes (Signed)
 Patient ID: Shannon Mora, female   DOB: 08/31/1960, 63 y.o.   MRN: [id number]  HPI: Shannon Mora is a 63 y.o.-year-old female, returning for follow-up for DM2, dx in 2015, prev. GDM 1989, 1992, non-insulin-dependent, controlled, without long-term complications and also hyperlipidemia.  Last visit 4 months ago.  Interim history: + increased urination (diuretic), blurry vision, nausea, chest pain. She restarted walking in the last week.  DM2: Reviewed HbA1c levels: 12/30/2023: HbA1c calculated from fructosamine is excellent, at 6.1%, correlating better with her blood sugars at home. Lab Results  Component Value Date   HGBA1C 7.3 (A) 12/30/2023   HGBA1C 6.8 (A) 09/07/2023   HGBA1C 6.8 03/05/2023  09/24/2023: HbA1c 7.0% 03/05/2023: HbA1c 6.8% 07/07/2017: HbA1c 6.8% 03/03/2017: HbA1c 6.5% 10/30/2016: HbA1c 6.6%  Patient is on: - Metformin  500 mg in a.m. >> at night >> 2x a day  She is checking her sugars 0-1x a day: - am:  114-135, 142 >> 122-131 >> 104-146 >> 109-153 - 2h after b'fast: 95-126 >> 112 >> 124-133 >> 154 >> 158 - before lunch: 88, 95 >> 85 >> 124 >> 88, 96 >> 100-128 - 2h after lunch: 119 >> 86-108 >> 122 >> n/c >> 113-145 - before dinner:  121 >> 88 >> 124 >> 80 >> 113 >> n/c - 2h after dinner:  109 >> n/c >> 100, 153 >> 128-173 - bedtime:  121, 168 >> n/c >> 113 >> n/c >> 101 >>  122 - nighttime: n/c Lowest sugar was 85 >> 88 >> 88 >> 100; it is unclear at which level she has hypoglycemia awareness. Highest sugar was 200 (rice) >> .SABRASABRA 154 >> 173  Glucometer: AccuCheck  Pt's meals are: - Breakfast: egg white, vegan links, coffee - Lunch: protein shake >> salad + [place] breast (1/4 cup) - Dinner: vegetarian dish or fish/poultry - Snacks: 1-2  -No CKD, last BUN/creatinine:  11/18/2023: 23/1.79, glucose 165 09/24/2023: GFR 83 Lab Results  Component Value Date   BUN 23 11/15/2021   BUN 22 08/09/2020   CREATININE 0.83 11/15/2021   CREATININE 0.72 08/09/2020    12/01/2022: MA/CR ratio <9.7 0.0-30.0 mg/G  UMA <0.7    UCR 72    She is on telmisartan .  - last eye exam was 2025: No DR reportedly, + macular degeneration (pseudovitelliform dystrophy - [facility] Dr. Daina (retired) >> now Dr. Katheleen in [facility]; also sees Dr. Alvia.   -No numbness and tingling in her feet.  Last foot exam 09/07/2023.  Pt has FH of DM in brother.  Hyperlipidemia: -+ Familial hyperlipidemia  Reviewed latest lipid panel: Lab Results  Component Value Date   CHOL 136 07/20/2023   CHOL 148 07/14/2022   CHOL 191 11/20/2021   Lab Results  Component Value Date   HDL 34 (L) 07/20/2023   HDL 32 (L) 07/14/2022   HDL 39 (L) 11/20/2021   Lab Results  Component Value Date   LDLCALC 63 07/20/2023   LDLCALC 80 07/14/2022   LDLCALC 127 (H) 11/20/2021   Lab Results  Component Value Date   TRIG 238 (H) 07/20/2023   TRIG 215 (H) 07/14/2022   TRIG 139 11/20/2021   Lab Results  Component Value Date   CHOLHDL 4.0 07/20/2023   CHOLHDL 4.6 (H) 07/14/2022   CHOLHDL 4.9 (H) 11/20/2021   Lab Results  Component Value Date   LDLDIRECT 150 (H) 04/24/2020   LDLDIRECT 105 (H) 10/28/2019   LDLDIRECT 104 (H) 08/24/2019  We tried fluvastatin  XL (after preauthorization).  She could not tolerate this due to muscle aches and joint aches even when she was taking it every 3 days. She was on ezetimibe  >> mm pain She is intolerant to statins: tried Lipitor, low-dose Crestor >> mm pain.  However, Crestor worked great for her >> LDL decreased to 51. She retried this 2020 >> joint pain She tried Fenofibrate and also Cholestyramine >> did not work. Currently on pravastatin  20 mg daily, Vascepa  2 g twice a day and Bempedoic acid  180 mg daily.  Her father died in his 58s from heart disease.  She has multiple relatives on father side with hyperlipidemia and heart disease. She fell and fractured her R tibial plateau early in 2025 now healed.  She tried Tymlos >> HR increased,  despite moving it every other day - Dr. Orpha at [facility]. She had to stop after 1 mo. She started Reclast .  ROS: + see HPI  I reviewed pt's medications, allergies, PMH, social hx, family hx, and changes were documented in the history of present illness. Otherwise, unchanged from my initial visit note.  Surgeries: - tubal ligation sx 1992 - gall bladder sx - low lumbar sx  Past Medical History:  Diagnosis Date   Basal cell cancer    On forehead.   GERD (gastroesophageal reflux disease)    HSV-1 (herpes simplex virus 1) infection    Hyperglycemia    Hyperlipidemia    Perimenopausal    Vitamin D deficiency    Social History   Socioeconomic History   Marital status: Married    Spouse name: Not on file   Number of children: 2  Social Needs  Occupational History   Not on file  Tobacco Use   Smoking status: Never Smoker   Smokeless tobacco: Never Used  Substance and Sexual Activity   Alcohol use: Yes    Alcohol/week: 0.6 oz    Types: 1 Glasses of hard cider per mo    Comment: monthly   Drug use: No   Current Outpatient Medications on File Prior to Visit  Medication Sig Dispense Refill   Bempedoic Acid  (NEXLETOL ) 180 MG TABS TAKE 1 TABLET DAILY 90 tablet 1   Cholecalciferol (VITAMIN D3) 2000 units TABS Take by mouth.     glucose blood (FREESTYLE LITE) test strip Use to check blood sugars 1-2x daily 200 each 3   hydrochlorothiazide  (MICROZIDE ) 12.5 MG capsule TAKE 1 CAPSULE DAILY 90 capsule 3   icosapent  Ethyl (VASCEPA ) 1 g capsule TAKE 2 CAPSULES TWICE A DAY 360 capsule 3   Lancets (FREESTYLE) lancets Use to check blood sugars 1-2x daily 200 each 3   Magnesium 500 MG TABS Take 1 tablet by mouth every morning.     meloxicam (MOBIC) 15 MG tablet Take 15 mg by mouth as needed.     metFORMIN  (GLUCOPHAGE ) 500 MG tablet TAKE 1 TABLET TWICE A DAY WITH MEALS 180 tablet 3   Multiple Vitamins-Minerals (EYE VITAMINS PO) Take 1 capsule by mouth daily.     Oyster Shell  (OYSTER CALCIUM) 500 MG TABS tablet Take 500 mg of elemental calcium by mouth 2 (two) times daily.     pravastatin  (PRAVACHOL ) 20 MG tablet Take 1 tablet (20 mg total) by mouth daily. 90 tablet 3   telmisartan  (MICARDIS ) 80 MG tablet TAKE 1 TABLET DAILY 90 tablet 1   No current facility-administered medications on file prior to visit.   Allergies  Allergen Reactions   Statins Other (See Comments)    Atorvastatin, rosuvastatin, simvastatin,  ezetimibe    Family History  Problem Relation Age of Onset   Heart disease Father    PE: BP 120/70   Pulse 85   Ht 5' 3 (1.6 m)   Wt 162 lb 3.2 oz (73.6 kg)   LMP 10/07/2013   SpO2 97%   BMI 28.73 kg/m  Wt Readings from Last 3 Encounters:  05/06/24 162 lb 3.2 oz (73.6 kg)  02/04/24 162 lb 9.6 oz (73.8 kg)  12/30/23 159 lb 3.2 oz (72.2 kg)   Constitutional: Slightly overweight, in NAD Eyes: EOMI, no exophthalmos ENT: no thyromegaly, no cervical lymphadenopathy Cardiovascular: RRR, No MRG Respiratory: CTA B Musculoskeletal: no deformities Skin: no rashes Neurological: no tremor with outstretched hands  ASSESSMENT: 1. DM2, non-insulin-dependent, controlled, without long term complications  2. HL  3.  Overweight  PLAN:  1. Patient with history of controlled type 2 diabetes, on metformin  only.  At last visit, sugars were at goal, except for a period of 2 weeks when she had a URI and using NyQuil, with subsequent higher blood sugars in the morning.  I advised her to stop NyQuil but did not change her regimen since most of the blood sugars were still at goal.  She was also increasing exercise (walking). Her HbA1c was higher, at 7.3%, however, this was not correlating well with the blood sugars at home.  HbA1c calculated from fructosamine was more accurate, at 6.1%. - At today's visit, sugars appear to be slightly higher than before, usually above target in the morning.  We discussed about possible reasons for fasting hyperglycemia to include  large or late dinners, increased hepatic gluconeogenesis overnight due to insulin resistance, and dawn phenomenon.  I recommended to continue exercise, which she just started.  We discussed that walking can help with blood sugars but ideally she would build up the exercise.  Also, I recommended to move the entire dose of metformin  with dinner.  At next visit, she may need intensification of treatment. - I suggested to:  Patient Instructions  Please move: - Metformin  1000 mg with dinner  Build up exercise.  Please return in 4 months with your sugar log.   - we checked her HbA1c: 7.5% (higher) - advised to check sugars at different times of the day - 1x a day, rotating check times - advised for yearly eye exams >> she is UTD - will check her ACR today. - she will need a foot exam at next visit - return to clinic in 4 months  2. HL - Patient has a family of familial hyperlipidemia with history of heart disease in her father in his early 19s -She did not tolerate Lipitor, Crestor, and fluvastatin  due to muscle cramps.  She tried fenofibrate and bile acid sequestrant but these did not work well for her. - Latest lipid panel was reviewed from 06/2023: Much improved, but still with elevated triglycerides and low HDL: Lab Results  Component Value Date   CHOL 136 07/20/2023   HDL 34 (L) 07/20/2023   LDLCALC 63 07/20/2023   LDLDIRECT 150 (H) 04/24/2020   TRIG 238 (H) 07/20/2023   CHOLHDL 4.0 07/20/2023  - She is on pravastatin  20 mg daily, Vascepa  2 g twice a day and Bempedoic acid  180 mg daily without side effects - She is seen in the lipid clinic a PCSK9 inhibitor was not approved for her.  3.  Overweight -In the past, she did well on a modified plant-based diet but she came off the diet afterwards -  She continues to pay close attention to her diet.  She is also on metformin  which has an appetite suppressant effect long-term - Weight was stable at last visit and she gained 3 pounds since  last visit  Lela Fendt, MD PhD [facility] End[facility]

## 2024-05-07 ENCOUNTER — Ambulatory Visit: Payer: Self-pay | Admitting: Internal Medicine

## 2024-05-07 LAB — MICROALBUMIN / CREATININE URINE RATIO
Creatinine, Urine: 62 mg/dL (ref 20–275)
Microalb Creat Ratio: 5 mg/g{creat} (ref <30)
Microalb, Ur: 0.3 mg/dL

## 2024-06-01 ENCOUNTER — Other Ambulatory Visit: Payer: Self-pay | Admitting: *Deleted

## 2024-06-01 DIAGNOSIS — T466X5A Adverse effect of antihyperlipidemic and antiarteriosclerotic drugs, initial encounter: Secondary | ICD-10-CM

## 2024-06-01 DIAGNOSIS — E785 Hyperlipidemia, unspecified: Secondary | ICD-10-CM

## 2024-06-15 ENCOUNTER — Encounter: Payer: Self-pay | Admitting: Internal Medicine

## 2024-06-15 DIAGNOSIS — E119 Type 2 diabetes mellitus without complications: Secondary | ICD-10-CM

## 2024-06-16 MED ORDER — FREESTYLE LITE TEST VI STRP: ORAL_STRIP | 12 refills | Status: DC

## 2024-06-27 MED ORDER — FREESTYLE LITE TEST VI STRP: ORAL_STRIP | 12 refills | Status: AC

## 2024-06-27 NOTE — Addendum Note (Signed)
 Addended by: CLEOTILDE ROLIN RAMAN on: 06/27/2024 07:30 AM   Modules accepted: Orders

## 2024-07-27 ENCOUNTER — Encounter: Payer: Self-pay | Admitting: Internal Medicine

## 2024-07-27 ENCOUNTER — Ambulatory Visit: Admitting: Internal Medicine

## 2024-07-27 ENCOUNTER — Encounter (HOSPITAL_BASED_OUTPATIENT_CLINIC_OR_DEPARTMENT_OTHER): Payer: Self-pay

## 2024-07-27 ENCOUNTER — Encounter (HOSPITAL_BASED_OUTPATIENT_CLINIC_OR_DEPARTMENT_OTHER): Admitting: Nurse Practitioner

## 2024-07-27 VITALS — BP 120/60 | HR 78 | Ht 63.0 in | Wt 158.2 lb

## 2024-07-27 DIAGNOSIS — E1165 Type 2 diabetes mellitus with hyperglycemia: Secondary | ICD-10-CM | POA: Diagnosis not present

## 2024-07-27 DIAGNOSIS — Z7984 Long term (current) use of oral hypoglycemic drugs: Secondary | ICD-10-CM | POA: Diagnosis not present

## 2024-07-27 DIAGNOSIS — E663 Overweight: Secondary | ICD-10-CM

## 2024-07-27 DIAGNOSIS — E785 Hyperlipidemia, unspecified: Secondary | ICD-10-CM

## 2024-07-27 LAB — POCT GLYCOSYLATED HEMOGLOBIN (HGB A1C): Hemoglobin A1C: 7.1 % — AB (ref 4.0–5.6)

## 2024-07-27 NOTE — Progress Notes (Signed)
 Patient ID: Shannon Mora, female   DOB: [birth year], 63 y.o.   MRN: [id number]  HPI: Shannon Mora is a 63 y.o.-year-old female, returning for follow-up for DM2, dx in 2015, prev. GDM 1989, 1992, non-insulin-dependent, controlled, without long-term complications and also hyperlipidemia.  Last visit 3 months ago.  Interim history: + increased urination (diuretic), no blurry vision, nausea, chest pain. She restarted walking the week prior to our last visit.  As of now, she is still walking, but not too much due to the weather being cold.  DM2: Reviewed HbA1c levels: Lab Results  Component Value Date   HGBA1C 7.5 (A) 05/06/2024   HGBA1C 7.3 (A) 12/30/2023   HGBA1C 6.8 (A) 09/07/2023  12/30/2023: HbA1c calculated from fructosamine is excellent, at 6.1%, correlating better with her blood sugars at home. 09/24/2023: HbA1c 7.0% 03/05/2023: HbA1c 6.8% 07/07/2017: HbA1c 6.8% 03/03/2017: HbA1c 6.5% 10/30/2016: HbA1c 6.6%  Patient is on: - Metformin  500 mg in a.m. >> at night >> 2x a day >> 1000 units at dinnertime  She is checking her sugars 0-1x a day: - am: 122-131 >> 104-146 >> 109-153 >> 98-127, 144 - 2h after b'fast: 112 >> 124-133 >> 154 >> 158 >> 134-150 - before lunch: 85 >> 124 >> 88, 96 >> 100-128 >> 92-121 - 2h after lunch: 119 >> 86-108 >> 122 >> n/c >> 113-145 >> n/c - before dinner:  88 >> 124 >> 80 >> 113 >> n/c >> 99-120 - 2h after dinner:  n/c >> 100, 153 >> 128-173 >> 105-162 - bedtime:  n/c >> 113 >> n/c >> 101 >>  122 >> 99 - nighttime: n/c Lowest sugar was 88 >> 100 >> 92; it is unclear at which level she has hypoglycemia awareness. Highest sugar was 200 (rice) >> .SABRASABRA 154 >> 173 >> 162  Glucometer: AccuCheck  Pt's meals are: - Breakfast: egg white, vegan links, coffee - Lunch: protein shake >> salad + turkey breast (1/4 cup) - Dinner: vegetarian dish or fish/poultry - Snacks: 1-2  -No CKD, last BUN/creatinine:  11/18/2023: 23/1.79, glucose 165 09/24/2023: GFR 83 Lab  Results  Component Value Date   BUN 23 11/15/2021   BUN 22 08/09/2020   CREATININE 0.83 11/15/2021   CREATININE 0.72 08/09/2020   Lab Results  Component Value Date   MICRALBCREAT 5 05/06/2024  She is on telmisartan .  - last eye exam was 2025: No DR reportedly, + macular degeneration (pseudovitelliform dystrophy - [facility] Dr. Daina (retired) >> now Dr. Katheleen in [facility]; also sees Dr. Alvia.   -No numbness and tingling in her feet.  Last foot exam 09/07/2023.  Pt has FH of DM in brother.  Hyperlipidemia: -+ Familial hyperlipidemia  Reviewed latest lipid panel: Lab Results  Component Value Date   CHOL 136 07/20/2023   CHOL 148 07/14/2022   CHOL 191 11/20/2021   Lab Results  Component Value Date   HDL 34 (L) 07/20/2023   HDL 32 (L) 07/14/2022   HDL 39 (L) 11/20/2021   Lab Results  Component Value Date   LDLCALC 63 07/20/2023   LDLCALC 80 07/14/2022   LDLCALC 127 (H) 11/20/2021   Lab Results  Component Value Date   TRIG 238 (H) 07/20/2023   TRIG 215 (H) 07/14/2022   TRIG 139 11/20/2021   Lab Results  Component Value Date   CHOLHDL 4.0 07/20/2023   CHOLHDL 4.6 (H) 07/14/2022   CHOLHDL 4.9 (H) 11/20/2021   Lab Results  Component Value Date   LDLDIRECT 150 (  H) 04/24/2020   LDLDIRECT 105 (H) 10/28/2019   LDLDIRECT 104 (H) 08/24/2019  We tried fluvastatin  XL (after preauthorization).  She could not tolerate this due to muscle aches and joint aches even when she was taking it every 3 days. She was on ezetimibe  >> mm pain She is intolerant to statins: tried Lipitor, low-dose Crestor >> mm pain.  However, Crestor worked great for her >> LDL decreased to 51. She retried this 2020 >> joint pain She tried Fenofibrate and also Cholestyramine >> did not work. Currently on pravastatin  20 mg daily, Vascepa  2 g twice a day and Bempedoic acid  180 mg daily.  Her father died in his 66s from heart disease.  She has multiple relatives on father side with  hyperlipidemia and heart disease. She fell and fractured her R tibial plateau early in 2025 now healed.  She tried Tymlos >> HR increased, despite moving it every other day - Dr. Orpha at [facility]. She had to stop after 1 mo. She started Reclast .  ROS: + see HPI  I reviewed pt's medications, allergies, PMH, social hx, family hx, and changes were documented in the history of present illness. Otherwise, unchanged from my initial visit note.  Surgeries: - tubal ligation sx 1992 - gall bladder sx - low lumbar sx  Past Medical History:  Diagnosis Date   Basal cell cancer    On forehead.   GERD (gastroesophageal reflux disease)    HSV-1 (herpes simplex virus 1) infection    Hyperglycemia    Hyperlipidemia    Perimenopausal    Vitamin D deficiency    Social History   Socioeconomic History   Marital status: Married    Spouse name: Not on file   Number of children: 2  Social Needs  Occupational History   Not on file  Tobacco Use   Smoking status: Never Smoker   Smokeless tobacco: Never Used  Substance and Sexual Activity   Alcohol use: Yes    Alcohol/week: 0.6 oz    Types: 1 Glasses of hard cider per mo    Comment: monthly   Drug use: No   Current Outpatient Medications on File Prior to Visit  Medication Sig Dispense Refill   Bempedoic Acid  (NEXLETOL ) 180 MG TABS TAKE 1 TABLET DAILY 90 tablet 1   Cholecalciferol (VITAMIN D3) 2000 units TABS Take by mouth.     glucose blood (FREESTYLE LITE) test strip Use to check blood sugars 1-2x daily 200 each 12   hydrochlorothiazide  (MICROZIDE ) 12.5 MG capsule TAKE 1 CAPSULE DAILY 90 capsule 3   icosapent  Ethyl (VASCEPA ) 1 g capsule TAKE 2 CAPSULES TWICE A DAY 360 capsule 3   Lancets (FREESTYLE) lancets Use to check blood sugars 1-2x daily 200 each 3   Magnesium 500 MG TABS Take 1 tablet by mouth every morning.     meloxicam (MOBIC) 15 MG tablet Take 15 mg by mouth as needed.     metFORMIN  (GLUCOPHAGE ) 500 MG tablet TAKE 1  TABLET TWICE A DAY WITH MEALS 180 tablet 3   Multiple Vitamins-Minerals (EYE VITAMINS PO) Take 1 capsule by mouth daily.     Oyster Shell (OYSTER CALCIUM) 500 MG TABS tablet Take 500 mg of elemental calcium by mouth 2 (two) times daily.     pravastatin  (PRAVACHOL ) 20 MG tablet Take 1 tablet (20 mg total) by mouth daily. 90 tablet 3   telmisartan  (MICARDIS ) 80 MG tablet TAKE 1 TABLET DAILY 90 tablet 1   No current facility-administered medications on  file prior to visit.   Allergies  Allergen Reactions   Statins Other (See Comments)    Atorvastatin, rosuvastatin, simvastatin, ezetimibe    Family History  Problem Relation Age of Onset   Heart disease Father    PE: BP 120/60   Pulse 78   Ht 5' 3 (1.6 m)   Wt 158 lb 3.2 oz (71.8 kg)   LMP 10/07/2013   SpO2 96%   BMI 28.02 kg/m  Wt Readings from Last 3 Encounters:  07/27/24 158 lb 3.2 oz (71.8 kg)  05/06/24 162 lb 3.2 oz (73.6 kg)  02/04/24 162 lb 9.6 oz (73.8 kg)   Constitutional: Slightly overweight, in NAD Eyes: EOMI, no exophthalmos ENT: no thyromegaly, no cervical lymphadenopathy Cardiovascular: RRR, No MRG Respiratory: CTA B Musculoskeletal: no deformities Skin: no rashes Neurological: no tremor with outstretched hands Diabetic Foot Exam - Simple   Simple Foot Form Diabetic Foot exam was performed with the following findings: Yes 07/27/2024  9:40 AM  Visual Inspection No deformities, no ulcerations, no other skin breakdown bilaterally: Yes Sensation Testing Intact to touch and monofilament testing bilaterally: Yes Pulse Check Posterior Tibialis and Dorsalis pulse intact bilaterally: Yes Comments    ASSESSMENT: 1. DM2, non-insulin-dependent, controlled, without long term complications  2. HL  3.  Overweight  PLAN:  1. Patient with history of controlled type 2 diabetes, which worsened at last visit.  At that time sugars are higher than before, usually above target in the morning.  We discussed all possible  reasons for fasting hyperglycemia to include larger late dinners, increased hepatic gluconeogenesis overnight due to insulin resistance and also dawn phenomenon.  She just started exercise and I strongly advised her to continue as a means to decrease her insulin resistance.  I advised her to build up on this.  We also discussed about moving the entire metformin  dose with dinner to hopefully help with blood sugars overnight.  HbA1c at that time was higher, at 7.5%. -At today's visit, her blood sugars and they appear to have improved, now mostly at goal.  No need to change his regimen for now.  We did discuss about increasing exercise-stationary bike or walking on treadmill when it is too cold outside. - I suggested to:  Patient Instructions  Please continue: - Metformin  1000 mg with dinner  Try to build up exercise.  Please return in 4 months with your sugar log.   - we checked her HbA1c: 7.1% (lower) - advised to check sugars at different times of the day - 1x a day, rotating check times - advised for yearly eye exams >> she is UTD - return to clinic in 4 months  2. HL - Patient with familial hyperlipidemia with history of heart disease in her father in his early 68s -She did not tolerate Lipitor, Crestor, and fluvastatin  due to muscle cramps.  She tried fenofibrate and bile acid sequestrant but these did not work well for her. - Latest lipid panel was reviewed from a year ago: LDL improved, HDL slightly low and triglycerides elevated: Lab Results  Component Value Date   CHOL 136 07/20/2023   HDL 34 (L) 07/20/2023   LDLCALC 63 07/20/2023   LDLDIRECT 150 (H) 04/24/2020   TRIG 238 (H) 07/20/2023   CHOLHDL 4.0 07/20/2023  - She is seen in the lipid clinic.  She takes pravastatin  20 mg daily, Bempedoic acid  180 mg daily, and Vascepa  2 g twice a day without side effects  - A PCSK9 inhibitor was not approved  for her.  3.  Overweight -In the past, she did well on a modified plant-based diet  but she came off the diet afterwards - Will continue metformin  which has an appetite suppressant effect long-term - Weight was stable at last visit - she lost 4 pounds since last visit, despite the holidays.  She improved diet by cutting down sweets.  Lela Fendt, MD PhD [facility] Endocrinology

## 2024-07-27 NOTE — Patient Instructions (Addendum)
 Please continue: - Metformin  1000 mg with dinner  Try to build up exercise.  Please return in 4 months with your sugar log.

## 2024-07-28 LAB — LIPID PANEL
Chol/HDL Ratio: 4.7 ratio — ABNORMAL HIGH (ref 0.0–4.4)
Cholesterol, Total: 183 mg/dL (ref 100–199)
HDL: 39 mg/dL — ABNORMAL LOW (ref >39)
LDL Chol Calc (NIH): 101 mg/dL — ABNORMAL HIGH (ref 0–99)
Triglycerides: 254 mg/dL — ABNORMAL HIGH (ref 0–149)
VLDL Cholesterol Cal: 43 mg/dL — ABNORMAL HIGH (ref 5–40)

## 2024-07-28 NOTE — Progress Notes (Unsigned)
  Cardiology Office Note   Date:  07/28/2024  ID:  Shannon Mora, DOB 1961-04-10, MRN [id number] PCP: Chrystal Lamarr RAMAN, MD  [facility] Providers Cardiologist:  None { Click to update primary MD,subspecialty MD or APP then REFRESH:1}    PMH Dyslipidemia Type 2 diabetes Hypertension Family history early onset CAD Calcium score of 0 on CT 06/2018  Referred to [facility] clinic and seen by Dr. Mona 06/17/2018.  Referred by endocrinologist for concern over dyslipidemia, specifically elevated triglycerides and high LDL.  Most recent direct LDL was 166, triglycerides 378.  She was unable to tolerate atorvastatin, rosuvastatin, simvastatin, ezetimibe , and fibrates due to significant back pain and joint stiffness.  A1c well-controlled at 6.4%.  She is not an alcohol user.  Family history of early onset heart disease in her father.  She did have some improvement in LDL on rosuvastatin but it was not tolerated.  CT calcium score 06/2018 was 0.  BP was elevated and she was advised to consider ACE or ARB given history of diabetes.  She was started on Nexletol  and had improvement with LDL to 95 initially, but at follow-up on 08/02/2020, LDL had increased to 134 and triglycerides remained over 200.  She was tolerating Nexletol  without any concerning symptoms.  At follow-up visit 11/06/2020, total cholesterol 143, triglycerides 230, HDL 36, and LDL 69.  She had started pravastatin  in addition to Nexletol  and Vascepa .  She had increased her exercise with walking 2-1/2 miles every day.  Last clinic visit was 07/28/2023 at which time she reported significant diet and lifestyle modifications.  Total cholesterol 136, HDL 34, triglycerides 238, and LDL 63.  She continued on Nexletol , pravastatin , and Vascepa .  BP was well-controlled.  She had been diagnosed with osteopenia in her right knee and was not able to exercise as much as she would like.  LDL target of 70.  Plan to continue current  medications.  Lipid panel completed 07/27/2024 with total cholesterol 183, triglycerides 254, HDL 39, LDL-C 101.   History of Present Illness Shannon Mora is a 63 y.o. female ***  ROS: ***  Studies Reviewed      ***  No results found for: LIPOA  Risk Assessment/Calculations {Does this patient have ATRIAL FIBRILLATION?:[id number]}         Physical Exam VS:  LMP 10/07/2013    Wt Readings from Last 3 Encounters:  07/27/24 158 lb 3.2 oz (71.8 kg)  05/06/24 162 lb 3.2 oz (73.6 kg)  02/04/24 162 lb 9.6 oz (73.8 kg)    GEN: Well nourished, well developed in no acute distress NECK: No JVD; No carotid bruits CARDIAC: ***RRR, no murmurs, rubs, gallops RESPIRATORY:  Clear to auscultation without rales, wheezing or rhonchi  ABDOMEN: Soft, non-tender, non-distended EXTREMITIES:  No edema; No deformity   ASSESSMENT AND PLAN ***    {Are you ordering a CV Procedure (e.g. stress test, cath, DCCV, TEE, etc)?   Press F2        :[id number]}  Dispo: ***  Signed, Rosaline Bane, NP-C

## 2024-08-02 ENCOUNTER — Ambulatory Visit: Payer: Self-pay | Admitting: Internal Medicine

## 2024-08-03 ENCOUNTER — Encounter (HOSPITAL_BASED_OUTPATIENT_CLINIC_OR_DEPARTMENT_OTHER): Payer: Self-pay | Admitting: Nurse Practitioner

## 2024-08-03 ENCOUNTER — Ambulatory Visit (INDEPENDENT_AMBULATORY_CARE_PROVIDER_SITE_OTHER): Admitting: Nurse Practitioner

## 2024-08-03 VITALS — BP 120/76 | HR 81 | Ht 63.0 in | Wt 158.0 lb

## 2024-08-03 DIAGNOSIS — Z7189 Other specified counseling: Secondary | ICD-10-CM

## 2024-08-03 DIAGNOSIS — E785 Hyperlipidemia, unspecified: Secondary | ICD-10-CM

## 2024-08-03 DIAGNOSIS — E119 Type 2 diabetes mellitus without complications: Secondary | ICD-10-CM

## 2024-08-03 DIAGNOSIS — E781 Pure hyperglyceridemia: Secondary | ICD-10-CM

## 2024-08-03 DIAGNOSIS — I10 Essential (primary) hypertension: Secondary | ICD-10-CM

## 2024-08-03 DIAGNOSIS — Z8249 Family history of ischemic heart disease and other diseases of the circulatory system: Secondary | ICD-10-CM

## 2024-08-03 DIAGNOSIS — R079 Chest pain, unspecified: Secondary | ICD-10-CM | POA: Diagnosis not present

## 2024-08-03 DIAGNOSIS — T466X5D Adverse effect of antihyperlipidemic and antiarteriosclerotic drugs, subsequent encounter: Secondary | ICD-10-CM

## 2024-08-03 DIAGNOSIS — I2510 Atherosclerotic heart disease of native coronary artery without angina pectoris: Secondary | ICD-10-CM

## 2024-08-03 DIAGNOSIS — M791 Myalgia, unspecified site: Secondary | ICD-10-CM | POA: Diagnosis not present

## 2024-08-03 MED ORDER — METOPROLOL TARTRATE 100 MG PO TABS: 100.0000 mg | ORAL_TABLET | Freq: Once | ORAL | 0 refills | Status: AC

## 2024-08-03 NOTE — Patient Instructions (Addendum)
 Adopting a Healthy Lifestyle.   Weight: Know what a healthy weight is for you (roughly BMI <25) and aim to maintain this. You can calculate your body mass index on your smart phone. Unfortunately, this is not the most accurate measure of healthy weight, but it is the simplest measurement to use. A more accurate measurement involves body scanning which measures lean muscle, fat tissue and bony density. We do not have this equipment at [facility].    Diet: Aim for 7+ servings of fruits and vegetables daily Limit animal fats in diet for cholesterol and heart health - choose grass fed whenever available Avoid highly processed foods (fast food burgers, tacos, fried chicken, pizza, hot dogs, french fries)  Saturated fat comes in the form of butter, lard, coconut oil, margarine, partially hydrogenated oils, dairy products, and fat in meat. These increase your risk of cardiovascular disease.  Use healthy plant oils, such as olive, canola, soy, corn, sunflower and peanut.  Whole foods such as fruits, vegetables and whole grains have fiber  Men need > 38 grams of fiber per day Women need > 25 grams of fiber per day  Load up on vegetables and fruits - one-half of your plate: Aim for color and variety, and remember that potatoes dont count. Go for whole grains - one-quarter of your plate: Whole wheat, barley, wheat berries, quinoa, oats, brown rice, and foods made with them. If you want pasta, go with whole wheat pasta. Protein power - one-quarter of your plate: Fish, chicken, beans, and nuts are all healthy, versatile protein sources. Limit red meat. You need carbohydrates for energy! The type of carbohydrate is more important than the amount. Choose carbohydrates such as vegetables, fruits, whole grains, beans, and nuts in the place of white rice, white pasta, potatoes (baked or fried), macaroni and cheese, cakes, cookies, and donuts.  If youre thirsty, drink water. Coffee and tea are good in moderation, but  skip sugary drinks and limit milk and dairy products to one or two daily servings. Keep sugar intake at 6 teaspoons or 24 grams or LESS       Exercise: Aim for 150 min of moderate intensity exercise weekly for heart health, and weights twice weekly for bone health Stay active - any steps are better than no steps! Aim for 7-9 hours of sleep daily   Sleep: This provides your body with the reset and relaxation that it needs!  Aim to get 7-8 hours of sleep each night. Limit caffeine, screen time, and other distractions prior to bedtime.  Keep your bedroom cool and dark and do not wear heavy clothing to bed or use heavy bed covers - layer if needed.     Medication Instructions:   Your physician recommends that you continue on your current medications as directed. Please refer to the Current Medication list given to you today.   *If you need a refill on your cardiac medications before your next appointment, please call your pharmacy*  Lab Work:  TODAY!!!! BMET  Your physician recommends that you return for a FASTING lipid profile, fasting after midnight in 6 months, one week prior to June appointment. Paperwork given to patient today.    If you have labs (blood work) drawn today and your tests are completely normal, you will receive your results only by: MyChart Message (if you have MyChart) OR A paper copy in the mail If you have any lab test that is abnormal or we need to change your treatment, we will call  you to review the results.  Testing/Procedures:    Your cardiac CT will be scheduled at one of the below locations:   [facility] [address]  [place], [place] [address]   If scheduled at the [facility] at [address], please enter the parking lot using the [address] entrance and use the FREE valet service at the patient drop-off area. Enter the building and check-in with registration on the main floor.  Please follow  these instructions carefully (unless otherwise directed):  An IV will be required for this test and Nitroglycerin will be given.   On the Night Before the Test: Be sure to Drink plenty of water. Do not consume any caffeinated/decaffeinated beverages or chocolate 12 hours prior to your test. Do not take any antihistamines 12 hours prior to your test.  On the Day of the Test: Drink plenty of water until 1 hour prior to the test. Do not eat any food 1 hour prior to test. You may take your regular medications prior to the test.  Take metoprolol (Lopressor) one (1) tablet ( 100 mg) two hours prior to test. If you take Hydrochlorothiazide  please HOLD on the morning of the test. Patients who wear a continuous glucose monitor MUST remove the device prior to scanning. FEMALES- please wear underwire-free bra if available, avoid dresses & tight clothing  After the Test: Drink plenty of water. After receiving IV contrast, you may experience a mild flushed feeling. This is normal. On occasion, you may experience a mild rash up to 24 hours after the test. This is not dangerous. If this occurs, you can take Benadryl  25 mg, Zyrtec, Claritin, or Allegra and increase your fluid intake. (Patients taking Tikosyn should avoid Benadryl , and may take Zyrtec, Claritin, or Allegra) If you experience trouble breathing, this can be serious. If it is severe call 911 IMMEDIATELY. If it is mild, please call our office.  We will call to schedule your test 2-4 weeks out understanding that some insurance companies will need an authorization prior to the service being performed.   For more information and frequently asked questions, please visit our website : [email or web address]  For non-scheduling related questions, please contact the cardiac imaging nurse navigator should you have any questions/concerns: Cardiac Imaging Nurse Navigators Direct Office Dial: [phone]   For scheduling needs, including  cancellations and rescheduling, please call Brittany, [phone].   Follow-Up: At [facility], you and your health needs are our priority.  As part of our continuing mission to provide you with exceptional heart care, our providers are all part of one team.  This team includes your primary Cardiologist (physician) and Advanced Practice Providers or APPs (Physician Assistants and Nurse Practitioners) who all work together to provide you with the care you need, when you need it.  Your next appointment:   6 month(s)  Provider:   K. Chad Hilty, MD or Rosaline Bane, NP    We recommend signing up for the patient portal called MyChart.  Sign up information is provided on this After Visit Summary.  MyChart is used to connect with patients for Virtual Visits (Telemedicine).  Patients are able to view lab/test results, encounter notes, upcoming appointments, etc.  Non-urgent messages can be sent to your provider as well.   To learn more about what you can do with MyChart, go to [email or web address].   Other Instructions  Your physician wants you to follow-up in: 6 months.  You will receive a  reminder letter in the mail two months in advance. If you don't receive a letter, please call our office to schedule the follow-up appointment.     Theses are the recommendations for exercise programs. Dr, Glade Che - exercise physiologist Dr. Wonda Silvan - orthopedic surgeon

## 2024-08-04 ENCOUNTER — Other Ambulatory Visit (HOSPITAL_BASED_OUTPATIENT_CLINIC_OR_DEPARTMENT_OTHER): Payer: Self-pay | Admitting: Nurse Practitioner

## 2024-08-04 DIAGNOSIS — M791 Myalgia, unspecified site: Secondary | ICD-10-CM

## 2024-08-04 DIAGNOSIS — E785 Hyperlipidemia, unspecified: Secondary | ICD-10-CM

## 2024-08-04 DIAGNOSIS — I10 Essential (primary) hypertension: Secondary | ICD-10-CM

## 2024-08-04 DIAGNOSIS — I2510 Atherosclerotic heart disease of native coronary artery without angina pectoris: Secondary | ICD-10-CM

## 2024-08-05 ENCOUNTER — Encounter (HOSPITAL_BASED_OUTPATIENT_CLINIC_OR_DEPARTMENT_OTHER): Payer: Self-pay | Admitting: Nurse Practitioner

## 2024-08-06 LAB — BASIC METABOLIC PANEL WITH GFR
BUN/Creatinine Ratio: 27 (ref 12–28)
BUN: 20 mg/dL (ref 8–27)
CO2: 25 mmol/L (ref 20–29)
Calcium: 10.2 mg/dL (ref 8.7–10.3)
Chloride: 100 mmol/L (ref 96–106)
Creatinine, Ser: 0.73 mg/dL (ref 0.57–1.00)
Glucose: 139 mg/dL — ABNORMAL HIGH (ref 70–99)
Potassium: 3.9 mmol/L (ref 3.5–5.2)
Sodium: 139 mmol/L (ref 134–144)
eGFR: 93 mL/min/1.73 (ref >59)

## 2024-08-07 ENCOUNTER — Other Ambulatory Visit (HOSPITAL_BASED_OUTPATIENT_CLINIC_OR_DEPARTMENT_OTHER): Payer: Self-pay | Admitting: Internal Medicine

## 2024-08-07 ENCOUNTER — Ambulatory Visit (HOSPITAL_BASED_OUTPATIENT_CLINIC_OR_DEPARTMENT_OTHER): Payer: Self-pay | Admitting: Nurse Practitioner

## 2024-08-07 DIAGNOSIS — I10 Essential (primary) hypertension: Secondary | ICD-10-CM

## 2024-08-11 ENCOUNTER — Encounter (HOSPITAL_COMMUNITY): Payer: Self-pay

## 2024-08-15 ENCOUNTER — Ambulatory Visit (HOSPITAL_COMMUNITY)
Admission: RE | Admit: 2024-08-15 | Discharge: 2024-08-15 | Disposition: A | Discharge status: Home / Self Care | Source: Ambulatory Visit | Attending: Nurse Practitioner | Admitting: Nurse Practitioner

## 2024-08-15 DIAGNOSIS — T466X5A Adverse effect of antihyperlipidemic and antiarteriosclerotic drugs, initial encounter: Secondary | ICD-10-CM | POA: Diagnosis not present

## 2024-08-15 DIAGNOSIS — M791 Myalgia, unspecified site: Secondary | ICD-10-CM | POA: Insufficient documentation

## 2024-08-15 DIAGNOSIS — R079 Chest pain, unspecified: Secondary | ICD-10-CM | POA: Diagnosis not present

## 2024-08-15 DIAGNOSIS — E785 Hyperlipidemia, unspecified: Secondary | ICD-10-CM | POA: Insufficient documentation

## 2024-08-15 DIAGNOSIS — I10 Essential (primary) hypertension: Secondary | ICD-10-CM | POA: Diagnosis not present

## 2024-08-15 MED ORDER — NITROGLYCERIN 0.4 MG SL SUBL
0.8000 mg | SUBLINGUAL_TABLET | Freq: Once | SUBLINGUAL | Status: AC
  Administered 2024-08-15: 0.8 mg via SUBLINGUAL

## 2024-08-15 MED ORDER — IOHEXOL 350 MG/ML SOLN
100.0000 mL | Freq: Once | INTRAVENOUS | Status: AC | PRN
  Administered 2024-08-15: 100 mL via INTRAVENOUS

## 2024-09-05 ENCOUNTER — Other Ambulatory Visit (HOSPITAL_BASED_OUTPATIENT_CLINIC_OR_DEPARTMENT_OTHER): Payer: Self-pay | Admitting: Internal Medicine

## 2024-11-30 ENCOUNTER — Ambulatory Visit: Admitting: Internal Medicine
